# Patient Record
Sex: Male | Born: 1960 | Race: White | Hispanic: No | Marital: Married | Smoking: Never smoker
Health system: Southern US, Community
[De-identification: ages and names within clinical notes are randomized; demographics above are authoritative.]

## PROBLEM LIST (undated history)

## (undated) DIAGNOSIS — E119 Type 2 diabetes mellitus without complications: Secondary | ICD-10-CM

## (undated) DIAGNOSIS — R42 Dizziness and giddiness: Secondary | ICD-10-CM

## (undated) DIAGNOSIS — M5432 Sciatica, left side: Secondary | ICD-10-CM

## (undated) DIAGNOSIS — M1990 Unspecified osteoarthritis, unspecified site: Secondary | ICD-10-CM

## (undated) DIAGNOSIS — D509 Iron deficiency anemia, unspecified: Secondary | ICD-10-CM

## (undated) DIAGNOSIS — K219 Gastro-esophageal reflux disease without esophagitis: Secondary | ICD-10-CM

## (undated) DIAGNOSIS — E785 Hyperlipidemia, unspecified: Secondary | ICD-10-CM

## (undated) DIAGNOSIS — Z87828 Personal history of other (healed) physical injury and trauma: Secondary | ICD-10-CM

## (undated) DIAGNOSIS — I10 Essential (primary) hypertension: Secondary | ICD-10-CM

## (undated) HISTORY — DX: Type 2 diabetes mellitus without complications: E11.9

## (undated) HISTORY — DX: Gastro-esophageal reflux disease without esophagitis: K21.9

## (undated) HISTORY — DX: Unspecified osteoarthritis, unspecified site: M19.90

## (undated) HISTORY — PX: TONSILLECTOMY: SUR1361

## (undated) HISTORY — DX: Essential (primary) hypertension: I10

## (undated) HISTORY — DX: Hyperlipidemia, unspecified: E78.5

## (undated) HISTORY — DX: Personal history of other (healed) physical injury and trauma: Z87.828

## (undated) HISTORY — PX: KNEE ARTHROSCOPY: SUR90

---

## 2008-07-01 ENCOUNTER — Ambulatory Visit: Payer: Self-pay | Admitting: General Surgery

## 2008-07-03 ENCOUNTER — Ambulatory Visit: Payer: Self-pay | Admitting: General Surgery

## 2008-07-16 ENCOUNTER — Ambulatory Visit: Payer: Self-pay | Admitting: General Surgery

## 2008-07-22 ENCOUNTER — Ambulatory Visit: Payer: Self-pay | Admitting: General Surgery

## 2008-08-03 ENCOUNTER — Ambulatory Visit: Payer: Self-pay | Admitting: General Surgery

## 2008-08-03 HISTORY — PX: HERNIA REPAIR: SHX51

## 2008-10-16 DIAGNOSIS — Z87828 Personal history of other (healed) physical injury and trauma: Secondary | ICD-10-CM

## 2008-10-16 HISTORY — DX: Personal history of other (healed) physical injury and trauma: Z87.828

## 2008-11-30 IMAGING — NM NUCLEAR MEDICINE HEPATOHBILIARY INCLUDE GB
2 series · 12 of 12 positions shown · non-contrast
Comparison: none

REASON FOR EXAM: RUQ pain
COMMENTS:

[Series 1000: gallbladder dynamic · 4.80mm/px · 6 of 60 frames shown]
[frame 6/60]
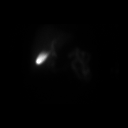
[frame 16/60]
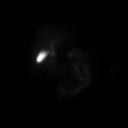
[frame 26/60]
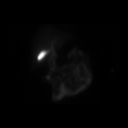
[frame 36/60]
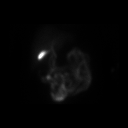
[frame 46/60]
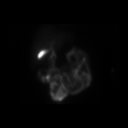
[frame 56/60]
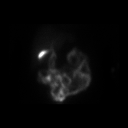

[Series 1000: gallbladder dynamic (results) · 4.80mm/px · 6 of 60 frames shown]
[frame 6/60]
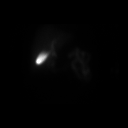
[frame 16/60]
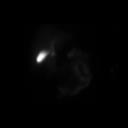
[frame 26/60]
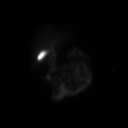
[frame 36/60]
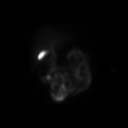
[frame 46/60]
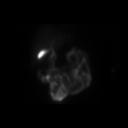
[frame 56/60]
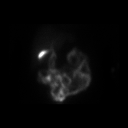

[12 of 12 positions shown; findings below may reference images not displayed]

PROCEDURE:     NM  - NM HEPATO WITH GB EJECT FRACTION  - July 16, 2008  [DATE]

RESULT:     The patient received 7.63 mCi of Technetium 99m labeled Choletec
for this study. The patient also received an intravenous drip infusion of
2.3 micrograms of Sincalide over 30 minutes.

There is adequate uptake of the radiopharmaceutical by the liver. The common
bile duct and gallbladder are visible by 10 minutes. Bowel activity is
visible by 15 minutes. The 30 minute gallbladder ejection fraction is normal
at 80%.
IMPRESSION: Normal Hepatobiliary Scan with normal gallbladder ejection
fraction. The patient experienced mild nausea during the CCK administration.

## 2008-12-06 IMAGING — RF DG UGI W/O KUB
1 series · 15 of 19 positions shown · non-contrast
Comparison: none

REASON FOR EXAM: Post prandial abdominal pain
COMMENTS:

[Series 1: run · 15 of 19 slices shown]
[im 1/19]
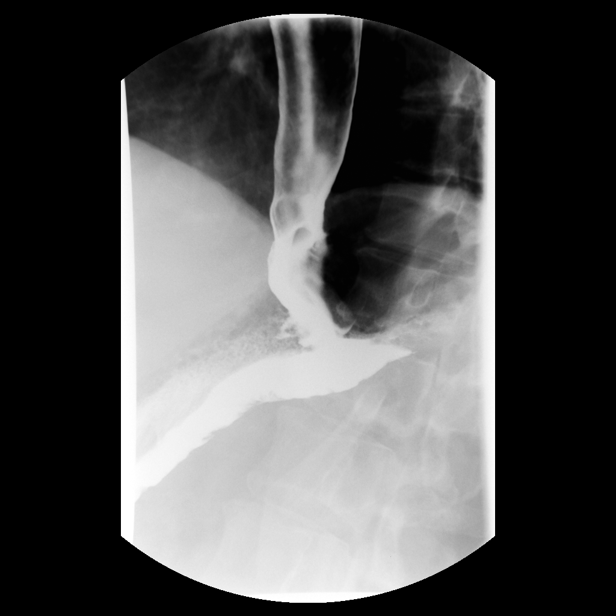
[im 2/19]
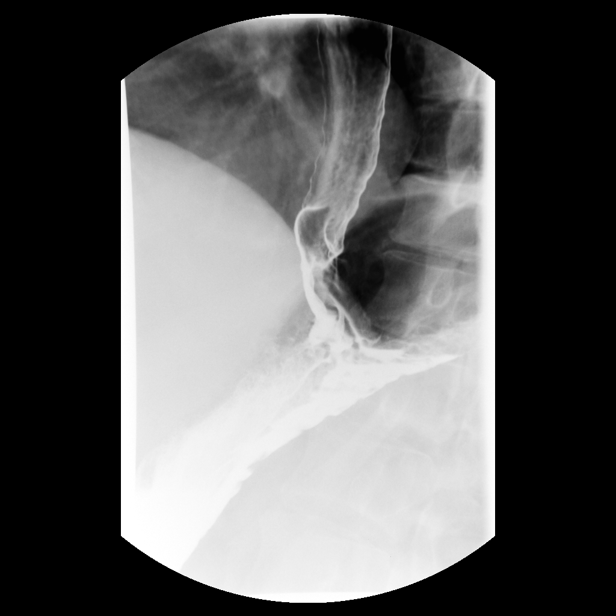
[im 4/19]
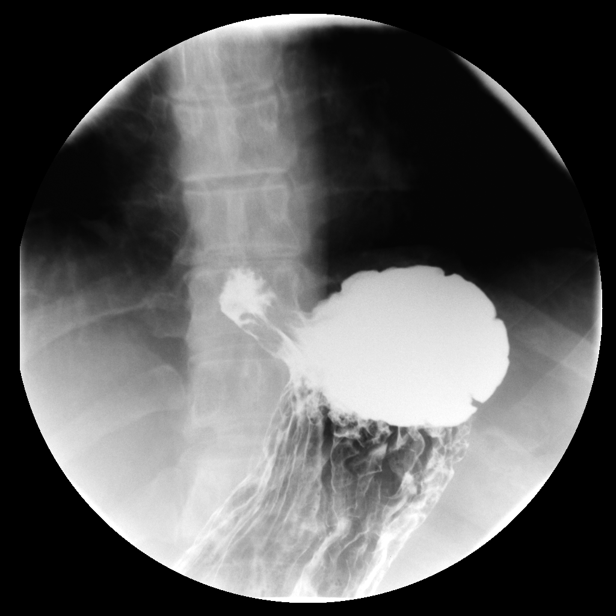
[im 5/19]
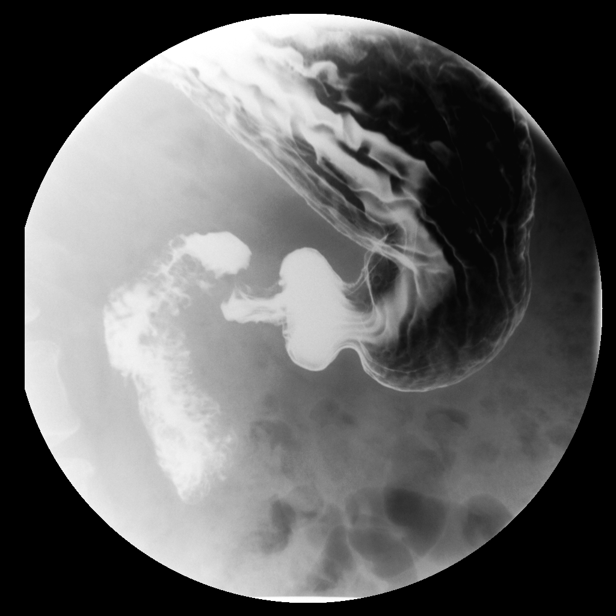
[im 6/19]
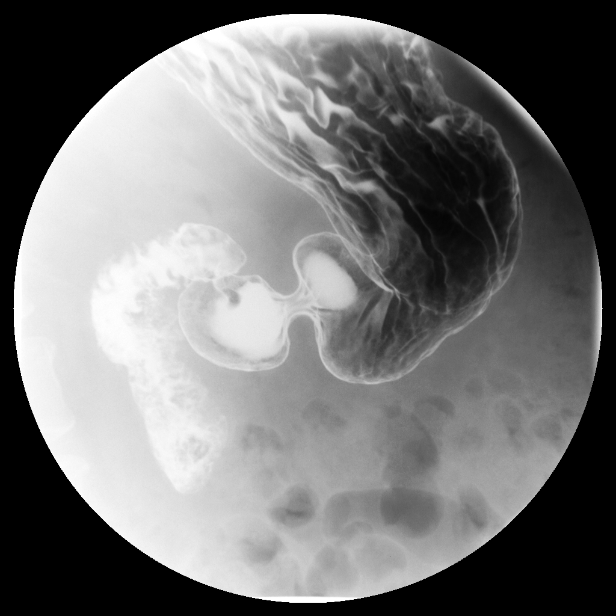
[im 7/19]
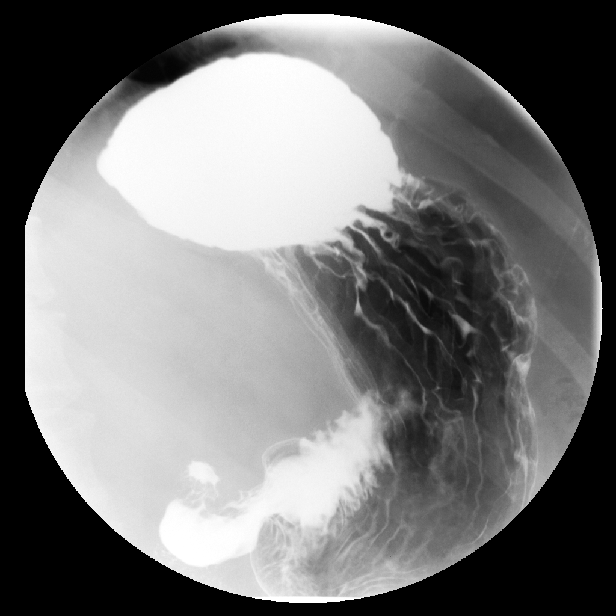
[im 9/19]
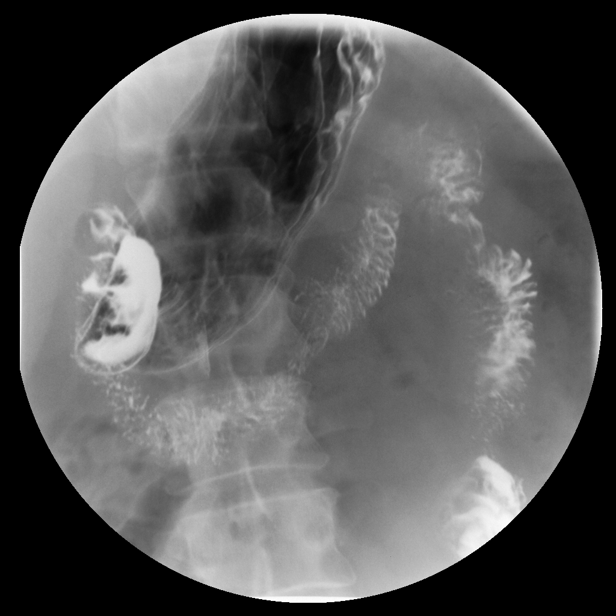
[im 10/19]
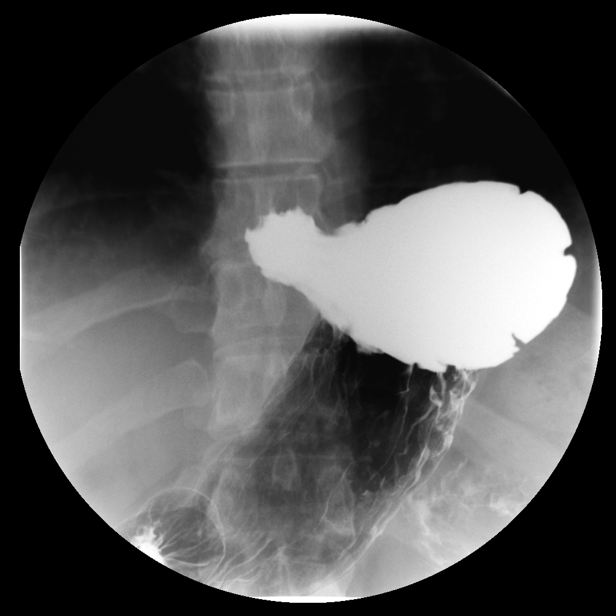
[im 11/19]
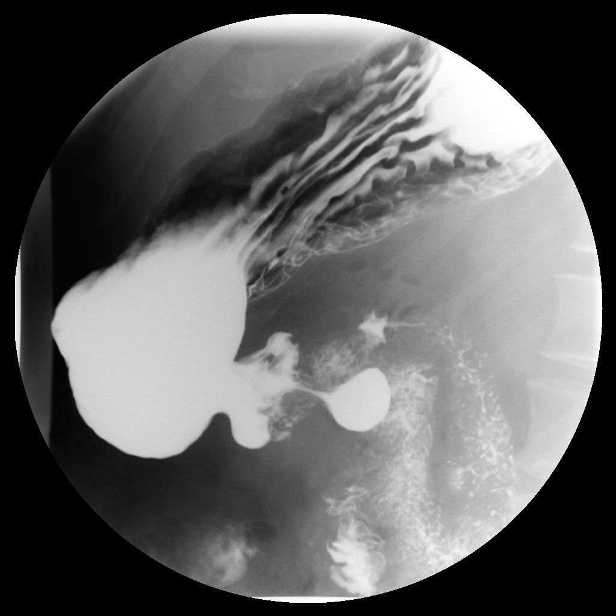
[im 13/19]
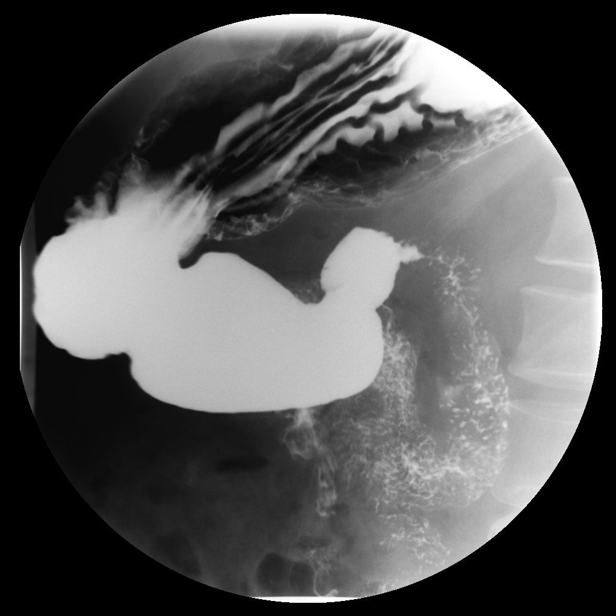
[im 14/19]
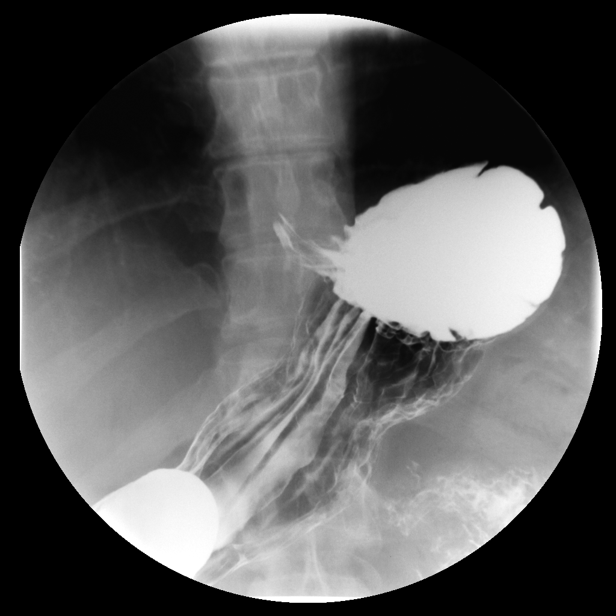
[im 15/19]
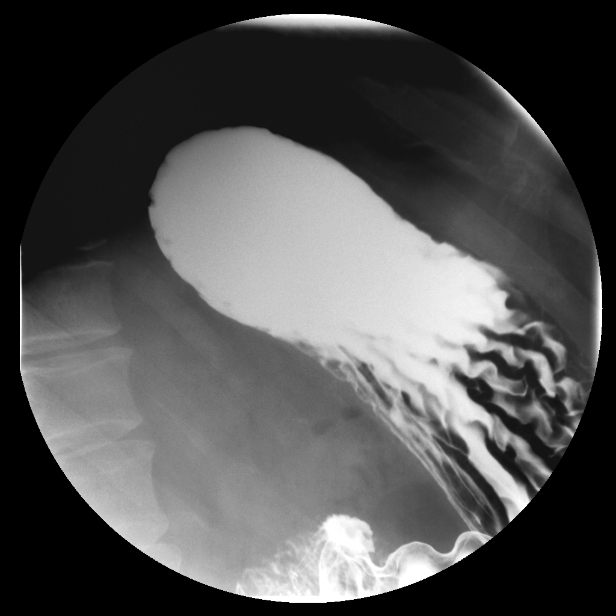
[im 16/19]
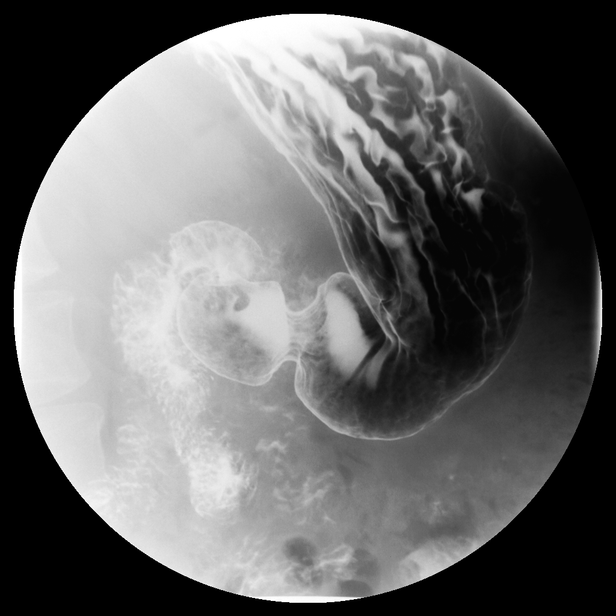
[im 18/19]
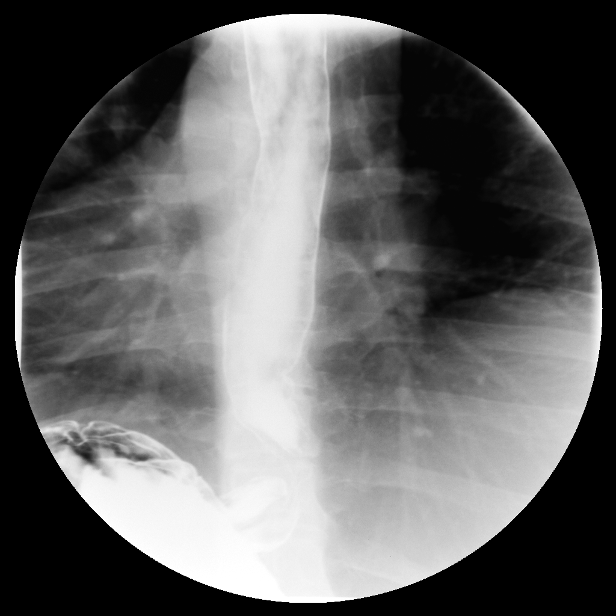
[im 19/19]
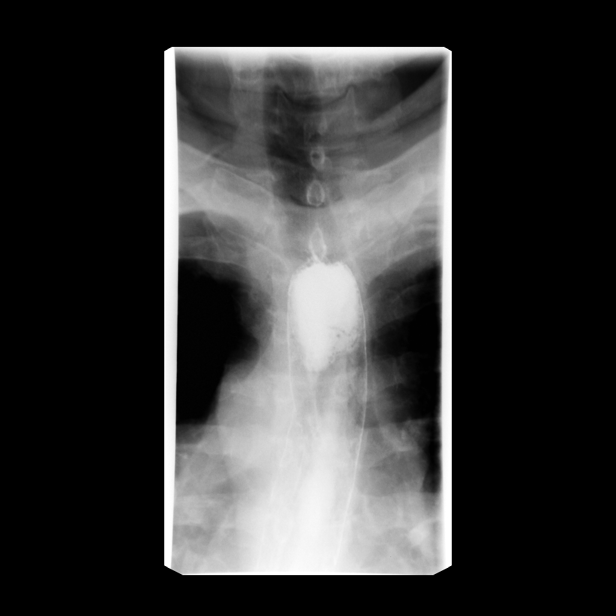

[15 of 19 positions shown; findings below may reference images not displayed]

PROCEDURE:     FL  - FL UPPER GI  - July 22, 2008  [DATE]

RESULT:     The patient is reporting abdominal discomfort following meals.
The patient has undergone work-up of the gallbladder which revealed no acute
abnormality.

The anticipated procedure was discussed with Mr. Pamplona. He voiced his
willingness to proceed.

The patient ingested barium without difficulty. Survey views of the
esophagus were normal. However, with the assumption of the prone position
there was marked gastroesophageal reflux to the level of the epiglottis.
There was no evidence of an esophageal stricture or of esophagitis.

The stomach distended well. The mucosal fold pattern was normal. Gastric
emptying appeared normal. The duodenal bulb and C-sweep were normal in
appearance. The 12.0 mm barium pill passed without difficulty.
IMPRESSION: 1.  There is severe gastroesophageal reflux with change of the patient from
the supine to prone position. This reproduced some of the patient's
symptoms.
2.  There is no evidence of a stricture or esophagitis.
3.  The stomach and duodenum are normal in appearance.

## 2008-12-14 ENCOUNTER — Emergency Department: Payer: Self-pay | Admitting: Internal Medicine

## 2011-04-13 ENCOUNTER — Emergency Department: Payer: Self-pay | Admitting: Unknown Physician Specialty

## 2012-07-12 ENCOUNTER — Emergency Department: Payer: Self-pay | Admitting: Emergency Medicine

## 2012-07-12 LAB — CBC
HCT: 42.3 % (ref 40.0–52.0)
MCHC: 34.8 g/dL (ref 32.0–36.0)
MCV: 83 fL (ref 80–100)
Platelet: 229 10*3/uL (ref 150–440)
RBC: 5.09 10*6/uL (ref 4.40–5.90)
RDW: 15.2 % — ABNORMAL HIGH (ref 11.5–14.5)
WBC: 7.1 10*3/uL (ref 3.8–10.6)

## 2012-07-12 LAB — BASIC METABOLIC PANEL
Anion Gap: 9 (ref 7–16)
BUN: 23 mg/dL — ABNORMAL HIGH (ref 7–18)
Calcium, Total: 9.2 mg/dL (ref 8.5–10.1)
EGFR (African American): >60
EGFR (Non-African Amer.): >60
Glucose: 190 mg/dL — ABNORMAL HIGH (ref 65–99)
Osmolality: 283 (ref 275–301)

## 2012-07-12 LAB — TROPONIN I: Troponin-I: <0.02 ng/mL

## 2012-07-13 LAB — CK TOTAL AND CKMB (NOT AT ARMC)
CK, Total: 173 U/L (ref 35–232)
CK-MB: 1.1 ng/mL (ref 0.5–3.6)

## 2012-07-13 LAB — TROPONIN I: Troponin-I: <0.02 ng/mL

## 2015-06-17 ENCOUNTER — Encounter: Payer: Self-pay | Admitting: *Deleted

## 2015-06-24 ENCOUNTER — Encounter: Payer: Self-pay | Admitting: General Surgery

## 2015-06-28 ENCOUNTER — Ambulatory Visit (INDEPENDENT_AMBULATORY_CARE_PROVIDER_SITE_OTHER): Payer: Managed Care, Other (non HMO) | Admitting: General Surgery

## 2015-06-28 ENCOUNTER — Encounter: Payer: Self-pay | Admitting: General Surgery

## 2015-06-28 VITALS — BP 120/74 | HR 84 | Resp 14 | Wt 249.0 lb

## 2015-06-28 DIAGNOSIS — R1031 Right lower quadrant pain: Secondary | ICD-10-CM | POA: Diagnosis not present

## 2015-06-28 DIAGNOSIS — R1030 Lower abdominal pain, unspecified: Secondary | ICD-10-CM | POA: Insufficient documentation

## 2015-06-28 NOTE — Patient Instructions (Signed)
Use 2 Aleve twice a day for 10 days. Use heat for comfort. Use proper lifting techniques.

## 2015-06-28 NOTE — Progress Notes (Signed)
Patient ID: Anthony Harvey, male   DOB: 1961/05/16, 54 y.o.   MRN: [id number]  Chief Complaint  Patient presents with  . Other    old surgical site pain    HPI Anthony Harvey is a 54 y.o. male here for assessment of pain in his right inguinal hernia repair site. This surgery was done on 08/03/2008. He first noticed the pain for the past 2 years. In the 3 months the pain has worsened and is occuring daily with activity. He reports that the pain is just below the repair site. He does report pain with bowel movements, but no difficulty with urinating. He did have a fall with back injury in 2010 and sees Dr Rennis Harding for this.  HPI  Past Medical History  Diagnosis Date  . Hypertension   . Hyperlipidemia   . Diabetes mellitus without complication   . GERD (gastroesophageal reflux disease)   . Arthritis   . Hx of back injury 2010    fell from piping and hit back    Past Surgical History  Procedure Laterality Date  . Hernia repair  08/03/2008    Right direct inguinal hernia, large Ultra Pro mesh.  . Tonsillectomy  as child    Family History  Problem Relation Age of Onset  . Cancer Mother   . Diabetes Mother     Social History Social History  Substance Use Topics  . Smoking status: Never Smoker   . Smokeless tobacco: Never Used  . Alcohol Use: No    Allergies  Allergen Reactions  . Celebrex [Celecoxib] Palpitations    Current Outpatient Prescriptions  Medication Sig Dispense Refill  . atorvastatin (LIPITOR) 20 MG tablet Take 20 mg by mouth daily.    . fenofibrate (TRICOR) 145 MG tablet Take 145 mg by mouth daily.    Marland Kitchen gemfibrozil (LOPID) 600 MG tablet Take 600 mg by mouth 2 (two) times daily.    Marland Kitchen HUMALOG MIX 75/25 KWIKPEN (75-25) 100 UNIT/ML Kwikpen Inject 25 Units as directed daily.    Marland Kitchen JANUVIA 100 MG tablet Take 50 mg by mouth daily.    Marland Kitchen lisinopril-hydrochlorothiazide (PRINZIDE,ZESTORETIC) 20-25 MG per tablet Take 1 tablet by mouth daily.    . metFORMIN  (GLUCOPHAGE) 500 MG tablet Take 500 mg by mouth 2 (two) times daily.    . metoprolol succinate (TOPROL-XL) 50 MG 24 hr tablet Take 50 mg by mouth daily.    Marland Kitchen omega-3 acid ethyl esters (LOVAZA) 1 G capsule Take 1 capsule by mouth daily.    Marland Kitchen omeprazole (PRILOSEC) 20 MG capsule Take 20 mg by mouth daily.    . traMADol (ULTRAM) 50 MG tablet Take 50 mg by mouth every 6 (six) hours as needed.    . traMADol (ULTRAM-ER) 300 MG 24 hr tablet Take 300 mg by mouth daily.    . TRULICITY 1.5 WU/9.8JX SOPN Inject 2 mLs as directed once a week.     No current facility-administered medications for this visit.    Review of Systems Review of Systems  Constitutional: Negative.   Respiratory: Negative.   Cardiovascular: Negative.   Gastrointestinal: Positive for abdominal pain (RLQ with bowel movements) and constipation. Negative for nausea, vomiting, diarrhea, blood in stool, abdominal distention, anal bleeding and rectal pain.  Genitourinary: Negative.     Blood pressure 120/74, pulse 84, resp. rate 14, weight 249 lb (112.946 kg).  Physical Exam Physical Exam  Constitutional: He is oriented to person, place, and time. He appears well-developed and well-nourished.  Eyes: Conjunctivae are normal. No scleral icterus.  Neck: Neck supple.  Cardiovascular: Normal rate, regular rhythm and normal heart sounds.   Pulmonary/Chest: Effort normal and breath sounds normal.  Abdominal: Soft. Normal appearance. There is tenderness (over the pubic tubercle). Hernia confirmed negative in the right inguinal area and confirmed negative in the left inguinal area.    Lymphadenopathy:    He has no cervical adenopathy.  Neurological: He is alert and oriented to person, place, and time.  Skin: Skin is warm and dry.  Psychiatric: He has a normal mood and affect.    Data Reviewed Operative report. 2.5 cm direct fascial defect noted. CT report from 07/01/2008 suggested fat within the inguinal canal but no definite  hernia. Assessment    Inguinal pain, right greater than left.    Plan    The patient reports she's having difficulty with his left lower extremity after his fall from 2010. He recognizes that he favors the left side, and this shift and weight may be accounting for the discomfort at the insertion of the rectus muscle and to the pubic tubercle. No indication of recurrent hernia at this time.  The patient reports he is able to tolerate Aleve. He was asked to make use of 2 tablets twice a day for 2 week course to see if this provides any benefit. Local heat was also encouraged.  Proper lifting technique reviewed.  He'll get a phone report after his trial of anti-inflammatory medication.    PCP:  Genevie Ann 06/29/2015, 11:57 AM

## 2016-11-14 ENCOUNTER — Encounter
Admission: RE | Admit: 2016-11-14 | Discharge: 2016-11-14 | Disposition: A | Discharge status: Home / Self Care | Payer: Managed Care, Other (non HMO) | Source: Ambulatory Visit | Attending: Orthopedic Surgery | Admitting: Orthopedic Surgery

## 2016-11-14 DIAGNOSIS — Z79899 Other long term (current) drug therapy: Secondary | ICD-10-CM | POA: Diagnosis not present

## 2016-11-14 DIAGNOSIS — S83221A Peripheral tear of medial meniscus, current injury, right knee, initial encounter: Secondary | ICD-10-CM | POA: Diagnosis not present

## 2016-11-14 DIAGNOSIS — S83001A Unspecified subluxation of right patella, initial encounter: Secondary | ICD-10-CM | POA: Diagnosis not present

## 2016-11-14 DIAGNOSIS — E785 Hyperlipidemia, unspecified: Secondary | ICD-10-CM | POA: Diagnosis not present

## 2016-11-14 DIAGNOSIS — I10 Essential (primary) hypertension: Secondary | ICD-10-CM | POA: Diagnosis not present

## 2016-11-14 DIAGNOSIS — K219 Gastro-esophageal reflux disease without esophagitis: Secondary | ICD-10-CM | POA: Diagnosis not present

## 2016-11-14 DIAGNOSIS — Z794 Long term (current) use of insulin: Secondary | ICD-10-CM | POA: Diagnosis not present

## 2016-11-14 DIAGNOSIS — E119 Type 2 diabetes mellitus without complications: Secondary | ICD-10-CM | POA: Diagnosis not present

## 2016-11-14 DIAGNOSIS — Z7982 Long term (current) use of aspirin: Secondary | ICD-10-CM | POA: Diagnosis not present

## 2016-11-14 DIAGNOSIS — X58XXXA Exposure to other specified factors, initial encounter: Secondary | ICD-10-CM | POA: Diagnosis not present

## 2016-11-14 NOTE — Pre-Procedure Instructions (Signed)
Had EKG  And labs at primary care in December of 2017, wife to fax results.

## 2016-11-14 NOTE — Patient Instructions (Signed)
  Your procedure is scheduled on: 11/16/16 Thurs Report to Same Day Surgery 2nd floor medical mall [facility] Entrance-take elevator on left to 2nd floor.  Check in with surgery information desk.) To find out your arrival time please call [phone] between 1PM - 3PM on 11/15/16 Wed  Remember: Instructions that are not followed completely may result in serious medical risk, up to and including death, or upon the discretion of your surgeon and anesthesiologist your surgery may need to be rescheduled.    _x___ 1. Do not eat food or drink liquids after midnight. No gum chewing or hard candies.     __x__ 2. No Alcohol for 24 hours before or after surgery.   __x__3. No Smoking for 24 prior to surgery.   ____  4. Bring all medications with you on the day of surgery if instructed.    __x__ 5. Notify your doctor if there is any change in your medical condition     (cold, fever, infections).     Do not wear jewelry, make-up, hairpins, clips or nail polish.  Do not wear lotions, powders, or perfumes. You may wear deodorant.  Do not shave 48 hours prior to surgery. Men may shave face and neck.  Do not bring valuables to the hospital.    [facility] is not responsible for any belongings or valuables.               Contacts, dentures or bridgework may not be worn into surgery.  Leave your suitcase in the car. After surgery it may be brought to your room.  For patients admitted to the hospital, discharge time is determined by your treatment team.   Patients discharged the day of surgery will not be allowed to drive home.  You will need someone to drive you home and stay with you the night of your procedure.    Please read over the following fact sheets that you were given:   [facility] Preparing for Surgery and or MRSA Information   _x___ Take these medicines the morning of surgery with A SIP OF WATER:    1. fenofibrate (TRICOR  2.metoprolol succinate (TOPROL-XL  3.omeprazole  (PRILOSEC  4.  5.  6.  ____Fleets enema or Magnesium Citrate as directed.   _x___ Use CHG Soap or sage wipes as directed on instruction sheet   ____ Use inhalers on the day of surgery and bring to hospital day of surgery  _x___ Stop metformin 2 days prior to surgery    ____ Take 1/2 of usual insulin dose the night before surgery and none on the morning of           surgery.   ____ Stop Aspirin, Coumadin, Pllavix ,Eliquis, Effient, or Pradaxa  x__ Stop Anti-inflammatories such as Advil, Aleve, Ibuprofen, Motrin, Naproxen,          Naprosyn, Goodies powders or aspirin products. Ok to take Tylenol.   ____ Stop supplements until after surgery.    ____ Bring C-Pap to the hospital.

## 2016-11-15 ENCOUNTER — Other Ambulatory Visit: Payer: Self-pay

## 2016-11-15 ENCOUNTER — Encounter
Admission: RE | Admit: 2016-11-15 | Discharge: 2016-11-15 | Disposition: A | Discharge status: Home / Self Care | Payer: Managed Care, Other (non HMO) | Source: Ambulatory Visit | Attending: Orthopedic Surgery | Admitting: Orthopedic Surgery

## 2016-11-15 DIAGNOSIS — S83221A Peripheral tear of medial meniscus, current injury, right knee, initial encounter: Secondary | ICD-10-CM | POA: Diagnosis not present

## 2016-11-16 ENCOUNTER — Ambulatory Visit: Payer: Managed Care, Other (non HMO) | Admitting: Anesthesiology

## 2016-11-16 ENCOUNTER — Encounter: Payer: Self-pay | Admitting: *Deleted

## 2016-11-16 ENCOUNTER — Ambulatory Visit
Admission: RE | Admit: 2016-11-16 | Discharge: 2016-11-16 | Disposition: A | Discharge status: Home / Self Care | Payer: Managed Care, Other (non HMO) | Source: Ambulatory Visit | Attending: Orthopedic Surgery | Admitting: Orthopedic Surgery

## 2016-11-16 ENCOUNTER — Encounter
Admission: RE | Disposition: A | Discharge status: Home / Self Care | Payer: Self-pay | Source: Ambulatory Visit | Attending: Orthopedic Surgery

## 2016-11-16 DIAGNOSIS — X58XXXA Exposure to other specified factors, initial encounter: Secondary | ICD-10-CM | POA: Insufficient documentation

## 2016-11-16 DIAGNOSIS — Z794 Long term (current) use of insulin: Secondary | ICD-10-CM | POA: Insufficient documentation

## 2016-11-16 DIAGNOSIS — I10 Essential (primary) hypertension: Secondary | ICD-10-CM | POA: Insufficient documentation

## 2016-11-16 DIAGNOSIS — K219 Gastro-esophageal reflux disease without esophagitis: Secondary | ICD-10-CM | POA: Insufficient documentation

## 2016-11-16 DIAGNOSIS — S83001A Unspecified subluxation of right patella, initial encounter: Secondary | ICD-10-CM | POA: Insufficient documentation

## 2016-11-16 DIAGNOSIS — Z7982 Long term (current) use of aspirin: Secondary | ICD-10-CM | POA: Insufficient documentation

## 2016-11-16 DIAGNOSIS — S83221A Peripheral tear of medial meniscus, current injury, right knee, initial encounter: Secondary | ICD-10-CM | POA: Insufficient documentation

## 2016-11-16 DIAGNOSIS — E785 Hyperlipidemia, unspecified: Secondary | ICD-10-CM | POA: Insufficient documentation

## 2016-11-16 DIAGNOSIS — E119 Type 2 diabetes mellitus without complications: Secondary | ICD-10-CM | POA: Insufficient documentation

## 2016-11-16 DIAGNOSIS — Z79899 Other long term (current) drug therapy: Secondary | ICD-10-CM | POA: Insufficient documentation

## 2016-11-16 HISTORY — PX: KNEE ARTHROSCOPY: SHX127

## 2016-11-16 LAB — GLUCOSE, CAPILLARY
GLUCOSE-CAPILLARY: 101 mg/dL — AB (ref 65–99)
Glucose-Capillary: 112 mg/dL — ABNORMAL HIGH (ref 65–99)

## 2016-11-16 SURGERY — ARTHROSCOPY, KNEE
Anesthesia: General | Laterality: Right | Wound class: Clean

## 2016-11-16 MED ORDER — DEXAMETHASONE SODIUM PHOSPHATE 10 MG/ML IJ SOLN
INTRAMUSCULAR | Status: AC
  Filled 2016-11-16: qty 1

## 2016-11-16 MED ORDER — BUPIVACAINE-EPINEPHRINE (PF) 0.5% -1:200000 IJ SOLN
INTRAMUSCULAR | Status: DC | PRN
  Administered 2016-11-16: 30 mL

## 2016-11-16 MED ORDER — FENTANYL CITRATE (PF) 100 MCG/2ML IJ SOLN: 25.0000 ug | INTRAMUSCULAR | Status: DC | PRN

## 2016-11-16 MED ORDER — MIDAZOLAM HCL 5 MG/5ML IJ SOLN
INTRAMUSCULAR | Status: DC | PRN
  Administered 2016-11-16: 2 mg via INTRAVENOUS

## 2016-11-16 MED ORDER — ONDANSETRON HCL 4 MG/2ML IJ SOLN
4.0000 mg | Freq: Once | INTRAMUSCULAR | Status: DC | PRN
Start: 2016-11-16 — End: 2016-11-16

## 2016-11-16 MED ORDER — ONDANSETRON HCL 4 MG PO TABS: 4.0000 mg | ORAL_TABLET | Freq: Four times a day (QID) | ORAL | Status: DC | PRN

## 2016-11-16 MED ORDER — CEFAZOLIN SODIUM-DEXTROSE 2-4 GM/100ML-% IV SOLN: 2.0000 g | Freq: Once | INTRAVENOUS | Status: DC

## 2016-11-16 MED ORDER — CEFAZOLIN IN D5W 1 GM/50ML IV SOLN
INTRAVENOUS | Status: DC | PRN
  Administered 2016-11-16: 2 g via INTRAVENOUS

## 2016-11-16 MED ORDER — SODIUM CHLORIDE 0.9 % IV SOLN: INTRAVENOUS | Status: DC

## 2016-11-16 MED ORDER — HYDROCODONE-ACETAMINOPHEN 5-325 MG PO TABS: 1.0000 | ORAL_TABLET | Freq: Four times a day (QID) | ORAL | 0 refills | Status: DC | PRN

## 2016-11-16 MED ORDER — BUPIVACAINE-EPINEPHRINE (PF) 0.5% -1:200000 IJ SOLN
INTRAMUSCULAR | Status: AC
  Filled 2016-11-16: qty 30

## 2016-11-16 MED ORDER — PROPOFOL 10 MG/ML IV BOLUS
INTRAVENOUS | Status: DC | PRN
  Administered 2016-11-16: 250 mg via INTRAVENOUS

## 2016-11-16 MED ORDER — GLYCOPYRROLATE 0.2 MG/ML IJ SOLN
INTRAMUSCULAR | Status: AC
Start: 2016-11-16 — End: 2016-11-16
  Filled 2016-11-16: qty 1

## 2016-11-16 MED ORDER — LIDOCAINE 2% (20 MG/ML) 5 ML SYRINGE
INTRAMUSCULAR | Status: DC | PRN
  Administered 2016-11-16: 100 mg via INTRAVENOUS

## 2016-11-16 MED ORDER — ONDANSETRON HCL 4 MG/2ML IJ SOLN: 4.0000 mg | Freq: Four times a day (QID) | INTRAMUSCULAR | Status: DC | PRN

## 2016-11-16 MED ORDER — FENTANYL CITRATE (PF) 100 MCG/2ML IJ SOLN
INTRAMUSCULAR | Status: DC | PRN
  Administered 2016-11-16: 50 ug via INTRAVENOUS
  Administered 2016-11-16: 25 ug via INTRAVENOUS

## 2016-11-16 MED ORDER — GLYCOPYRROLATE 0.2 MG/ML IJ SOLN
INTRAMUSCULAR | Status: DC | PRN
  Administered 2016-11-16: 0.2 mg via INTRAVENOUS

## 2016-11-16 MED ORDER — CEFAZOLIN SODIUM-DEXTROSE 2-4 GM/100ML-% IV SOLN
INTRAVENOUS | Status: AC
  Filled 2016-11-16: qty 100

## 2016-11-16 MED ORDER — DEXMEDETOMIDINE HCL 200 MCG/2ML IV SOLN
INTRAVENOUS | Status: DC | PRN
  Administered 2016-11-16: 8 ug via INTRAVENOUS

## 2016-11-16 MED ORDER — METOCLOPRAMIDE HCL 10 MG PO TABS: 5.0000 mg | ORAL_TABLET | Freq: Three times a day (TID) | ORAL | Status: DC | PRN

## 2016-11-16 MED ORDER — ONDANSETRON HCL 4 MG/2ML IJ SOLN
INTRAMUSCULAR | Status: AC
  Filled 2016-11-16: qty 2

## 2016-11-16 MED ORDER — METOCLOPRAMIDE HCL 5 MG/ML IJ SOLN: 5.0000 mg | Freq: Three times a day (TID) | INTRAMUSCULAR | Status: DC | PRN

## 2016-11-16 MED ORDER — HYDROCODONE-ACETAMINOPHEN 5-325 MG PO TABS
1.0000 | ORAL_TABLET | ORAL | Status: DC | PRN
  Administered 2016-11-16: 1 via ORAL

## 2016-11-16 MED ORDER — FENTANYL CITRATE (PF) 100 MCG/2ML IJ SOLN
INTRAMUSCULAR | Status: AC
  Filled 2016-11-16: qty 2

## 2016-11-16 MED ORDER — SODIUM CHLORIDE 0.9 % IV SOLN
INTRAVENOUS | Status: DC
  Administered 2016-11-16 (×2): via INTRAVENOUS

## 2016-11-16 MED ORDER — MIDAZOLAM HCL 2 MG/2ML IJ SOLN
INTRAMUSCULAR | Status: AC
  Filled 2016-11-16: qty 2

## 2016-11-16 MED ORDER — HYDROCODONE-ACETAMINOPHEN 5-325 MG PO TABS
ORAL_TABLET | ORAL | Status: AC
  Filled 2016-11-16: qty 1

## 2016-11-16 MED ORDER — LIDOCAINE HCL (PF) 2 % IJ SOLN
INTRAMUSCULAR | Status: AC
  Filled 2016-11-16: qty 2

## 2016-11-16 MED ORDER — PROPOFOL 500 MG/50ML IV EMUL
INTRAVENOUS | Status: AC
  Filled 2016-11-16: qty 50

## 2016-11-16 SURGICAL SUPPLY — 28 items
BANDAGE ACE 4X5 VEL STRL LF (GAUZE/BANDAGES/DRESSINGS) IMPLANT
BANDAGE ELASTIC 4 LF NS (GAUZE/BANDAGES/DRESSINGS) ×2 IMPLANT
BLADE FULL RADIUS 3.5 (BLADE) IMPLANT
BLADE INCISOR PLUS 4.5 (BLADE) ×2 IMPLANT
BLADE SHAVER 4.5 DBL SERAT CV (CUTTER) IMPLANT
BLADE SHAVER 4.5X7 STR FR (MISCELLANEOUS) ×2 IMPLANT
CHLORAPREP W/TINT 26ML (MISCELLANEOUS) ×2 IMPLANT
CUFF TOURN 24 STER (MISCELLANEOUS) IMPLANT
CUFF TOURN 30 STER DUAL PORT (MISCELLANEOUS) ×2 IMPLANT
GAUZE SPONGE 4X4 12PLY STRL (GAUZE/BANDAGES/DRESSINGS) ×2 IMPLANT
GLOVE SURG SYN 9.0  PF PI (GLOVE) ×1
GLOVE SURG SYN 9.0 PF PI (GLOVE) ×1 IMPLANT
GOWN SRG 2XL LVL 4 RGLN SLV (GOWNS) ×1 IMPLANT
GOWN STRL NON-REIN 2XL LVL4 (GOWNS) ×1
GOWN STRL REUS W/ TWL LRG LVL3 (GOWN DISPOSABLE) ×2 IMPLANT
GOWN STRL REUS W/TWL LRG LVL3 (GOWN DISPOSABLE) ×2
IV LACTATED RINGER IRRG 3000ML (IV SOLUTION) ×2
IV LR IRRIG 3000ML ARTHROMATIC (IV SOLUTION) ×2 IMPLANT
KIT RM TURNOVER STRD PROC AR (KITS) ×2 IMPLANT
MANIFOLD NEPTUNE II (INSTRUMENTS) ×2 IMPLANT
PACK ARTHROSCOPY KNEE (MISCELLANEOUS) ×2 IMPLANT
SET TUBE SUCT SHAVER OUTFL 24K (TUBING) ×2 IMPLANT
SET TUBE TIP INTRA-ARTICULAR (MISCELLANEOUS) ×2 IMPLANT
SUT ETHILON 4-0 (SUTURE) ×1
SUT ETHILON 4-0 FS2 18XMFL BLK (SUTURE) ×1
SUTURE ETHLN 4-0 FS2 18XMF BLK (SUTURE) ×1 IMPLANT
TUBING ARTHRO INFLOW-ONLY STRL (TUBING) ×2 IMPLANT
WAND HAND CNTRL MULTIVAC 50 (MISCELLANEOUS) ×2 IMPLANT

## 2016-11-16 NOTE — H&P (Signed)
Reviewed paper H+P, will be scanned into chart. No changes noted.  

## 2016-11-16 NOTE — Anesthesia Preprocedure Evaluation (Signed)
Anesthesia Evaluation  Patient identified by MRN, date of birth, ID band Patient awake    Reviewed: Allergy & Precautions, NPO status , Patient's Chart, lab work & pertinent test results, reviewed documented beta blocker date and time   Airway Mallampati: II  TM Distance: >3 FB     Dental  (+) Chipped   Pulmonary           Cardiovascular hypertension, Pt. on medications and Pt. on home beta blockers      Neuro/Psych    GI/Hepatic GERD  Controlled,  Endo/Other  diabetes, Type 2  Renal/GU      Musculoskeletal  (+) Arthritis ,   Abdominal   Peds  Hematology   Anesthesia Other Findings   Reproductive/Obstetrics                             Anesthesia Physical Anesthesia Plan  ASA: II  Anesthesia Plan: General   Post-op Pain Management:    Induction: Intravenous  Airway Management Planned: LMA  Additional Equipment:   Intra-op Plan:   Post-operative Plan:   Informed Consent: I have reviewed the patients History and Physical, chart, labs and discussed the procedure including the risks, benefits and alternatives for the proposed anesthesia with the patient or authorized representative who has indicated his/her understanding and acceptance.     Plan Discussed with: CRNA  Anesthesia Plan Comments:         Anesthesia Quick Evaluation

## 2016-11-16 NOTE — Discharge Instructions (Addendum)
AMBULATORY SURGERY  DISCHARGE INSTRUCTIONS   1) The drugs that you were given will stay in your system until tomorrow so for the next 24 hours you should not:  A) Drive an automobile B) Make any legal decisions C) Drink any alcoholic beverage   2) You may resume regular meals tomorrow.  Today it is better to start with liquids and gradually work up to solid foods.  You may eat anything you prefer, but it is better to start with liquids, then soup and crackers, and gradually work up to solid foods.   3) Please notify your doctor immediately if you have any unusual bleeding, trouble breathing, redness and pain at the surgery site, drainage, fever, or pain not relieved by medication.    4) Additional Instructions:        Please contact your physician with any problems or Same Day Surgery at [phone], Monday through Friday 6 am to 4 pm, or [place] at [facility] number at [phone].Minimize activities through the weekend. Try to keep leg propped up. Take aspirin 325 mg daily until walking normally. Ace wrap if it feels too tight. If bandage slides down the leg remove entire bandage covered to incisions with a Band-Aid each and then rewrap only Ace wrap not ane of the padding. Pain medicine as directed

## 2016-11-16 NOTE — Anesthesia Postprocedure Evaluation (Signed)
Anesthesia Post Note  Patient: Anthony Harvey  Procedure(s) Performed: Procedure(s) (LRB): ARTHROSCOPY KNEE, lateral release, partial meniscectomy (Right)  Patient location during evaluation: PACU Anesthesia Type: General Level of consciousness: awake and alert Pain management: pain level controlled Vital Signs Assessment: post-procedure vital signs reviewed and stable Respiratory status: spontaneous breathing, nonlabored ventilation, respiratory function stable and patient connected to nasal cannula oxygen Cardiovascular status: blood pressure returned to baseline and stable Postop Assessment: no signs of nausea or vomiting Anesthetic complications: no     Last Vitals:  Vitals:   11/16/16 1223 11/16/16 1300  BP: (!) 130/107 (!) 149/80  Pulse: 75 73  Resp: 16 16  Temp: 36.1 C     Last Pain:  Vitals:   11/16/16 1300  TempSrc:   PainSc: [place]

## 2016-11-16 NOTE — Op Note (Signed)
11/16/2016  11:20 AM  PATIENT:  Anthony Harvey  56 y.o. 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  BLOOD ADMINISTERED:none  DRAINS: none   LOCAL MEDICATIONS USED:  MARCAINE     SPECIMEN:  No Specimen  DISPOSITION OF SPECIMEN:  N/A  COUNTS:  YES  TOURNIQUET:    IMPLANTS: None  DICTATION: .Dragon Dictation patient brought the operating room and after adequate anesthesia was obtained, the right leg was placed in the arthroscopic leg holder with tourniquet applied. After prepping and draping the sterile fashion, appropriate patient identification and timeout procedure were completed. Inferolateral portal was made and the arthroscope was introduced. Initial inspection revealed some debris floating around the knee but no loose bodies gutters were free of any loose body there was patellar subluxation with central chondromalacia but no fibrillation down to the bone. There was some synovitis present at super patellar area which was later debrided for better visualization prior to the lateral release. Coming around medially and inferior medial portal was made and on probing there was a tear of the very most posterior aspect of the meniscus close to its root attachment as well as a radial tear at the junction of the middle and posterior thirds these were addressed with meniscal Punch shaver and ArthriCare wand total a stable margin was obtained. There was significant chondromalacia over both femoral tibial condyles with significant areas of loss of the superficial articular cartilage but nothing deeper than that. Anterior cruciate ligament was  intact lateral compartment was normal. After having addressed the medial meniscal pathology a lateral release was carried out and the knee was irrigated until clear following this with pre-and post procedure pictures having been obtained argentation was withdrawn and the wounds closed with simple interrupted 4-0 nylon with 30 cc half percent Sensorcaine infiltrated into the joint for postop analgesia. the wound was then wrapped with Xeroform 4 x 4 web roll and Ace wrap  PLAN OF CARE: Discharge to home after PACU  PATIENT DISPOSITION:  PACU - hemodynamically stable.

## 2016-11-16 NOTE — Anesthesia Procedure Notes (Signed)
Procedure Name: LMA Insertion Date/Time: 11/16/2016 10:25 AM Performed by: Marsh Dolly Pre-anesthesia Checklist: Patient identified, Patient being monitored, Timeout performed, Emergency Drugs available and Suction available Patient Re-evaluated:Patient Re-evaluated prior to inductionOxygen Delivery Method: Circle system utilized Preoxygenation: Pre-oxygenation with 100% oxygen Intubation Type: IV induction Ventilation: Mask ventilation without difficulty LMA: LMA inserted LMA Size: 5.0 Tube type: Oral Number of attempts: 1 Placement Confirmation: positive ETCO2 and breath sounds checked- equal and bilateral Tube secured with: Tape Dental Injury: Teeth and Oropharynx as per pre-operative assessment

## 2016-11-16 NOTE — Anesthesia Post-op Follow-up Note (Cosign Needed)
Anesthesia QCDR form completed.        

## 2016-11-16 NOTE — Transfer of Care (Signed)
Immediate Anesthesia Transfer of Care Note  Patient: Anthony Harvey  Procedure(s) Performed: Procedure(s): ARTHROSCOPY KNEE, lateral release, partial meniscectomy (Right)  Patient Location: PACU  Anesthesia Type:General  Level of Consciousness: sedated  Airway & Oxygen Therapy: Patient Spontanous Breathing and Patient connected to face mask oxygen  Post-op Assessment: Report given to RN and Post -op Vital signs reviewed and stable  Post vital signs: Reviewed and stable  Last Vitals:  Vitals:   11/16/16 0841 11/16/16 1124  BP: 138/75 122/71  Pulse: 77 79  Resp: 14 14  Temp: 36.2 C [id number] C    Complications: No apparent anesthesia complications

## 2016-12-01 ENCOUNTER — Other Ambulatory Visit
Admission: RE | Admit: 2016-12-01 | Discharge: 2016-12-01 | Disposition: A | Discharge status: Home / Self Care | Payer: Managed Care, Other (non HMO) | Source: Ambulatory Visit | Attending: Orthopedic Surgery | Admitting: Orthopedic Surgery

## 2016-12-01 DIAGNOSIS — Z9889 Other specified postprocedural states: Secondary | ICD-10-CM | POA: Diagnosis present

## 2016-12-01 DIAGNOSIS — M25461 Effusion, right knee: Secondary | ICD-10-CM | POA: Insufficient documentation

## 2016-12-01 LAB — SYNOVIAL CELL COUNT + DIFF, W/ CRYSTALS
CRYSTALS FLUID: NONE SEEN
EOSINOPHILS-SYNOVIAL: 0 %
LYMPHOCYTES-SYNOVIAL FLD: 36 %
Monocyte-Macrophage-Synovial Fluid: 14 %
NEUTROPHIL, SYNOVIAL: 50 %
Other Cells-SYN: 0
WBC, SYNOVIAL: 10270 /mm3 — AB (ref 0–200)

## 2016-12-04 LAB — GRAM STAIN

## 2016-12-09 LAB — CULTURE, BODY FLUID W GRAM STAIN -BOTTLE: Culture: NO GROWTH

## 2017-04-09 ENCOUNTER — Ambulatory Visit
Admission: RE | Admit: 2017-04-09 | Discharge: 2017-04-09 | Disposition: A | Discharge status: Home / Self Care | Payer: Disability Insurance | Source: Ambulatory Visit | Attending: Dentistry | Admitting: Dentistry

## 2017-04-09 ENCOUNTER — Other Ambulatory Visit: Payer: Self-pay | Admitting: Dentistry

## 2017-04-09 DIAGNOSIS — M25512 Pain in left shoulder: Secondary | ICD-10-CM | POA: Insufficient documentation

## 2017-04-09 DIAGNOSIS — G8929 Other chronic pain: Secondary | ICD-10-CM

## 2017-11-23 ENCOUNTER — Other Ambulatory Visit: Payer: Self-pay | Admitting: Orthopedic Surgery

## 2017-11-23 DIAGNOSIS — D482 Neoplasm of uncertain behavior of peripheral nerves and autonomic nervous system: Secondary | ICD-10-CM

## 2017-11-28 ENCOUNTER — Other Ambulatory Visit: Payer: Self-pay | Admitting: Orthopedic Surgery

## 2017-11-28 DIAGNOSIS — D482 Neoplasm of uncertain behavior of peripheral nerves and autonomic nervous system: Secondary | ICD-10-CM

## 2017-11-28 DIAGNOSIS — M25561 Pain in right knee: Secondary | ICD-10-CM

## 2017-12-07 ENCOUNTER — Ambulatory Visit
Admission: RE | Admit: 2017-12-07 | Discharge: 2017-12-07 | Disposition: A | Discharge status: Home / Self Care | Payer: 59 | Source: Ambulatory Visit | Attending: Orthopedic Surgery | Admitting: Orthopedic Surgery

## 2017-12-07 DIAGNOSIS — M1711 Unilateral primary osteoarthritis, right knee: Secondary | ICD-10-CM | POA: Diagnosis not present

## 2017-12-07 DIAGNOSIS — M25561 Pain in right knee: Secondary | ICD-10-CM | POA: Diagnosis present

## 2017-12-07 DIAGNOSIS — D482 Neoplasm of uncertain behavior of peripheral nerves and autonomic nervous system: Secondary | ICD-10-CM | POA: Diagnosis present

## 2017-12-07 LAB — POCT I-STAT CREATININE: Creatinine, Ser: 1.1 mg/dL (ref 0.61–1.24)

## 2017-12-07 MED ORDER — GADOBENATE DIMEGLUMINE 529 MG/ML IV SOLN
20.0000 mL | Freq: Once | INTRAVENOUS | Status: AC | PRN
  Administered 2017-12-07: 20 mL via INTRAVENOUS

## 2020-01-01 ENCOUNTER — Ambulatory Visit: Payer: Disability Insurance | Attending: Internal Medicine

## 2020-01-01 DIAGNOSIS — Z23 Encounter for immunization: Secondary | ICD-10-CM

## 2020-01-01 NOTE — Progress Notes (Signed)
   Covid-19 Vaccination Clinic  Name:  Anthony Harvey    MRN: [id number] DOB: 1961-04-17  01/01/2020  Mr. Anthony Harvey was observed post Covid-19 immunization for 15 minutes without incident. He was provided with Vaccine Information Sheet and instruction to access the V-Safe system.   Anthony Harvey was instructed to call 911 with any severe reactions post vaccine: Marland Kitchen Difficulty breathing  . Swelling of face and throat  . A fast heartbeat  . A bad rash all over body  . Dizziness and weakness   Immunizations Administered    Name Date Dose VIS Date Route   Pfizer COVID-19 Vaccine 01/01/2020 12:22 PM 0.3 mL 09/26/2019 Intramuscular   Manufacturer: [place]   Lot: [id number]   [place]: [id number]

## 2020-01-26 ENCOUNTER — Ambulatory Visit: Payer: Disability Insurance | Attending: Internal Medicine

## 2020-01-26 DIAGNOSIS — Z23 Encounter for immunization: Secondary | ICD-10-CM

## 2020-01-26 NOTE — Progress Notes (Signed)
   Covid-19 Vaccination Clinic  Name:  Anthony Harvey    MRN: [id number] DOB: December 23, 1960  01/26/2020  Mr. Braxton was observed post Covid-19 immunization for 15 minutes without incident. He was provided with Vaccine Information Sheet and instruction to access the V-Safe system.   Mr. Petrella was instructed to call 911 with any severe reactions post vaccine: Marland Kitchen Difficulty breathing  . Swelling of face and throat  . A fast heartbeat  . A bad rash all over body  . Dizziness and weakness   Immunizations Administered    Name Date Dose VIS Date Route   Pfizer COVID-19 Vaccine 01/26/2020 10:29 AM 0.3 mL 09/26/2019 Intramuscular   Manufacturer: [place]   Lot: [id number]   [place]: [id number]

## 2022-03-12 ENCOUNTER — Encounter: Payer: Self-pay | Admitting: Emergency Medicine

## 2022-03-12 ENCOUNTER — Ambulatory Visit
Admission: EM | Admit: 2022-03-12 | Discharge: 2022-03-12 | Disposition: A | Discharge status: Home / Self Care | Payer: Medicare Other | Attending: Family Medicine | Admitting: Family Medicine

## 2022-03-12 DIAGNOSIS — R42 Dizziness and giddiness: Secondary | ICD-10-CM | POA: Insufficient documentation

## 2022-03-12 DIAGNOSIS — E1165 Type 2 diabetes mellitus with hyperglycemia: Secondary | ICD-10-CM | POA: Diagnosis not present

## 2022-03-12 DIAGNOSIS — J029 Acute pharyngitis, unspecified: Secondary | ICD-10-CM

## 2022-03-12 DIAGNOSIS — R110 Nausea: Secondary | ICD-10-CM | POA: Diagnosis present

## 2022-03-12 DIAGNOSIS — Z20822 Contact with and (suspected) exposure to covid-19: Secondary | ICD-10-CM

## 2022-03-12 LAB — POCT FASTING CBG KUC MANUAL ENTRY: POCT Glucose (KUC): 194 mg/dL — AB (ref 70–99)

## 2022-03-12 LAB — POCT RAPID STREP A (OFFICE): Rapid Strep A Screen: NEGATIVE

## 2022-03-12 MED ORDER — AMOXICILLIN 875 MG PO TABS: 875.0000 mg | ORAL_TABLET | Freq: Two times a day (BID) | ORAL | 0 refills | Status: DC

## 2022-03-12 MED ORDER — ONDANSETRON HCL 4 MG PO TABS: 4.0000 mg | ORAL_TABLET | Freq: Four times a day (QID) | ORAL | 0 refills | Status: DC

## 2022-03-12 NOTE — ED Provider Notes (Signed)
Anthony Harvey    CSN: [id number] Arrival date & time: 03/12/22  1433      History   Chief Complaint Chief Complaint  Patient presents with   Sore Throat   Nausea   Dizziness   Headache    HPI Anthony Harvey is a 61 y.o. male.   HPI Patient presents with a two day history of worsening sore throat, fatigue, nausea, and headache. He is uncertain if he has had fever. Endorses poor appetite. He has been tolerating fluids. He has not have any OTC medications today. He has had a possible exposure to illness as has been staying at the hospital with his mother who is currently admitted for an unrelated matter. He denies any measurable URI symptoms or actual abdominal pain.  Past Medical History:  Diagnosis Date   Arthritis    Diabetes mellitus without complication ([place])    GERD (gastroesophageal reflux disease)    Hx of back injury 2010   fell from piping and hit back   Hyperlipidemia    Hypertension     Patient Active Problem List   Diagnosis Date Noted   Groin pain 06/28/2015    Past Surgical History:  Procedure Laterality Date   HERNIA REPAIR  08/03/2008   Right direct inguinal hernia, large Ultra Pro mesh.   KNEE ARTHROSCOPY Bilateral    KNEE ARTHROSCOPY Right 11/16/2016   Procedure: ARTHROSCOPY KNEE, lateral release, partial meniscectomy;  Surgeon: Hessie Knows, MD;  Location: [facility] ORS;  Service: Orthopedics;  Laterality: Right;   TONSILLECTOMY  as child       Home Medications    Prior to Admission medications   Medication Sig Start Date End Date Taking? Authorizing Provider  amoxicillin (AMOXIL) 875 MG tablet Take 1 tablet (875 mg total) by mouth 2 (two) times daily. 03/12/22  Yes Scot Jun, FNP  aspirin EC 81 MG tablet Take 81 mg by mouth at bedtime.   Yes [provider]  atorvastatin (LIPITOR) 40 MG tablet Take 40 mg by mouth daily at 6 PM.    Yes [provider]  fenofibrate (TRICOR) 145 MG tablet Take 145 mg by mouth  daily. 06/14/15  Yes [provider]  HUMALOG MIX 75/25 KWIKPEN (75-25) 100 UNIT/ML Kwikpen Inject 45 Units as directed 2 (two) times daily. With breakfast and at bedtime. 05/31/15  Yes [provider]  lisinopril-hydrochlorothiazide (PRINZIDE,ZESTORETIC) 20-25 MG per tablet Take 1 tablet by mouth daily. 06/14/15  Yes [provider]  metFORMIN (GLUCOPHAGE) 1000 MG tablet Take 1,000 mg by mouth 2 (two) times daily with a meal. 09/18/16  Yes [provider]  metoprolol succinate (TOPROL-XL) 50 MG 24 hr tablet Take 50 mg by mouth daily. 06/14/15  Yes [provider]  omega-3 acid ethyl esters (LOVAZA) 1 G capsule Take 1 g by mouth 2 (two) times daily.  06/14/15  Yes [provider]  omeprazole (PRILOSEC) 20 MG capsule Take 20 mg by mouth at bedtime.  06/14/15  Yes [provider]  ondansetron (ZOFRAN) 4 MG tablet Take 1 tablet (4 mg total) by mouth every 6 (six) hours. 03/12/22  Yes Scot Jun, FNP  sitaGLIPtin (JANUVIA) 50 MG tablet Take 50 mg by mouth 2 (two) times daily.   Yes [provider]  traMADol (ULTRAM) 50 MG tablet Take 50 mg by mouth every 6 (six) hours as needed (pain).  05/31/15  Yes [provider]  traMADol (ULTRAM-ER) 300 MG 24 hr tablet Take 300 mg by  mouth daily. 06/22/15  Yes [provider]  docusate sodium (COLACE) 100 MG capsule Take 100 mg by mouth at bedtime.    [provider]  HYDROcodone-acetaminophen (NORCO) 5-325 MG tablet Take 1 tablet by mouth every 6 (six) hours as needed for moderate pain. 11/16/16   Hessie Knows, MD  TRULICITY 1.5 WH/6.7RF SOPN Inject 1.5 mg as directed every 7 (seven) days. On Tuesdays 06/22/15   [provider]    Family History Family History  Problem Relation Age of Onset   Cancer Mother    Diabetes Mother     Social History Social History   Tobacco Use   Smoking status: Never   Smokeless tobacco: Never  Substance Use Topics    Alcohol use: No    Alcohol/week: 0.0 standard drinks   Drug use: No     Allergies   Cymbalta [duloxetine hcl], Pregabalin, Celebrex [celecoxib], and Niacin   Review of Systems Review of Systems Pertinent negatives listed in HPI   Physical Exam Triage Vital Signs ED Triage Vitals  Enc Vitals Group     BP 03/12/22 1517 (!) 148/81     Pulse Rate 03/12/22 1517 75     Resp 03/12/22 1517 18     Temp 03/12/22 1518 98.1 F (36.7 C)     Temp src --      SpO2 03/12/22 1517 98 %     Weight --      Height --      Head Circumference --      Peak Flow --      Pain Score 03/12/22 1517 0     Pain Loc --      Pain Edu? --      Excl. in [place]? --    No data found.  Updated Vital Signs BP (!) 148/81 (BP Location: Left Arm)   Pulse 75   Temp 98.1 F (36.7 C)   Resp 18   SpO2 98%   Visual Acuity Right Eye Distance:   Left Eye Distance:   Bilateral Distance:    Right Eye Near:   Left Eye Near:    Bilateral Near:     Physical Exam Vitals reviewed.  Constitutional:      Appearance: He is well-developed.  HENT:     Head: Normocephalic and atraumatic.     Mouth/Throat:     Mouth: No oral lesions.     Pharynx: Posterior oropharyngeal erythema present. No pharyngeal swelling.     Tonsils: 0 on the right. 0 on the left.  Cardiovascular:     Rate and Rhythm: Normal rate and regular rhythm.  Abdominal:     General: Bowel sounds are increased.     Palpations: Abdomen is soft.     Tenderness: There is no abdominal tenderness.  Musculoskeletal:     Cervical back: Normal range of motion.  Lymphadenopathy:     Cervical: No cervical adenopathy.  Skin:    General: Skin is warm and dry.     Capillary Refill: Capillary refill takes less than 2 seconds.  Neurological:     General: No focal deficit present.     Mental Status: He is alert and oriented to person, place, and time.  Psychiatric:        Behavior: Behavior normal.     UC Treatments / Results  Labs (all labs ordered  are listed, but only abnormal results are displayed) Labs Reviewed  POCT FASTING CBG [facility] MANUAL ENTRY - Abnormal; Notable for the  following components:      Result Value   POCT Glucose ([facility]) 194 (*)    All other components within normal limits  COVID-19, FLU A+B AND RSV   Narrative:    Performed at:  [address], [place], [place]  [id number] Lab Director: Rush Farmer MD, Phone:  [phone]  CULTURE, GROUP A STREP [facility])  POCT RAPID STREP A (OFFICE)    EKG   Radiology No results found.  Procedures Procedures (including critical care time)  Medications Ordered in UC Medications - No data to display  Initial Impression / Assessment and Plan / UC Course  I have reviewed the triage vital signs and the nursing notes.  Pertinent labs & imaging results that were available during my care of the patient were reviewed by me and considered in my medical decision making (see chart for details).    Acute pharyngitis, rapid strep negative  Nausea without vomiting Dizziness- no acute focal symptoms  Hyperglycemia, 194 elevated but stable. Continue to monitor. COVID test pending. Final Clinical Impressions(s) / UC Diagnoses   Final diagnoses:  Acute pharyngitis, unspecified etiology  Nausea without vomiting  Dizziness  Encounter for laboratory testing for COVID-19 virus  Hyperglycemia due to diabetes mellitus [facility])     Discharge Instructions      COVID/Flu test pending and will result within 3-4 days due to the holiday. If any of your symptoms worsen I would recommend evaluation in the emergency department. I am treating you for possible strep pharyngitis with amoxicillin.  However I cannot rule out that your symptoms are not not related to a COVID-19 virus.  Hydrate with plenty of fluids.  I have prescribed Zofran for management of nausea.  Force fluids.  Recommend rest. t CDC isolation/quarantine recommendation advised.       ED Prescriptions      Medication Sig Dispense Auth. Provider   ondansetron (ZOFRAN) 4 MG tablet Take 1 tablet (4 mg total) by mouth every 6 (six) hours. 12 tablet Scot Jun, FNP   amoxicillin (AMOXIL) 875 MG tablet Take 1 tablet (875 mg total) by mouth 2 (two) times daily. 20 tablet Scot Jun, FNP      PDMP not reviewed this encounter.   Scot Jun, [place] 03/16/22 [phone]

## 2022-03-12 NOTE — ED Triage Notes (Signed)
Pt c/o ST, nausea, HA and dizziness since yesterday.

## 2022-03-12 NOTE — Discharge Instructions (Addendum)
COVID/Flu test pending and will result within 3-4 days due to the holiday. If any of your symptoms worsen I would recommend evaluation in the emergency department. I am treating you for possible strep pharyngitis with amoxicillin.  However I cannot rule out that your symptoms are not not related to a COVID-19 virus.  Hydrate with plenty of fluids.  I have prescribed Zofran for management of nausea.  Force fluids.  Recommend rest. t CDC isolation/quarantine recommendation advised.

## 2022-03-14 LAB — COVID-19, FLU A+B AND RSV
Influenza A, NAA: NOT DETECTED
Influenza B, NAA: NOT DETECTED
RSV, NAA: NOT DETECTED
SARS-CoV-2, NAA: NOT DETECTED

## 2022-03-15 LAB — CULTURE, GROUP A STREP (THRC)

## 2022-03-15 NOTE — ED Provider Notes (Incomplete)
Anthony Harvey    CSN: [id number] Arrival date & time: 03/12/22  1433      History   Chief Complaint Chief Complaint  Patient presents with  . Sore Throat  . Nausea  . Dizziness  . Headache    HPI Anthony Harvey is a 61 y.o. male.   HPI Patient presents today with nausea, dizziness, HA , sore throat x 2 days. He has been at hospital all week with   Past Medical History:  Diagnosis Date  . Arthritis   . Diabetes mellitus without complication ([place])   . GERD (gastroesophageal reflux disease)   . Hx of back injury 2010   fell from piping and hit back  . Hyperlipidemia   . Hypertension     Patient Active Problem List   Diagnosis Date Noted  . Groin pain 06/28/2015    Past Surgical History:  Procedure Laterality Date  . HERNIA REPAIR  08/03/2008   Right direct inguinal hernia, large Ultra Pro mesh.  Marland Kitchen KNEE ARTHROSCOPY Bilateral   . KNEE ARTHROSCOPY Right 11/16/2016   Procedure: ARTHROSCOPY KNEE, lateral release, partial meniscectomy;  Surgeon: Hessie Knows, MD;  Location: [facility] ORS;  Service: Orthopedics;  Laterality: Right;  . TONSILLECTOMY  as child       Home Medications    Prior to Admission medications   Medication Sig Start Date End Date Taking? Authorizing Provider  aspirin EC 81 MG tablet Take 81 mg by mouth at bedtime.   Yes [provider]  atorvastatin (LIPITOR) 40 MG tablet Take 40 mg by mouth daily at 6 PM.    Yes [provider]  fenofibrate (TRICOR) 145 MG tablet Take 145 mg by mouth daily. 06/14/15  Yes [provider]  HUMALOG MIX 75/25 KWIKPEN (75-25) 100 UNIT/ML Kwikpen Inject 45 Units as directed 2 (two) times daily. With breakfast and at bedtime. 05/31/15  Yes [provider]  lisinopril-hydrochlorothiazide (PRINZIDE,ZESTORETIC) 20-25 MG per tablet Take 1 tablet by mouth daily. 06/14/15  Yes [provider]  metFORMIN (GLUCOPHAGE) 1000 MG tablet Take 1,000 mg by mouth 2 (two) times daily with  a meal. 09/18/16  Yes [provider]  metoprolol succinate (TOPROL-XL) 50 MG 24 hr tablet Take 50 mg by mouth daily. 06/14/15  Yes [provider]  omega-3 acid ethyl esters (LOVAZA) 1 G capsule Take 1 g by mouth 2 (two) times daily.  06/14/15  Yes [provider]  omeprazole (PRILOSEC) 20 MG capsule Take 20 mg by mouth at bedtime.  06/14/15  Yes [provider]  sitaGLIPtin (JANUVIA) 50 MG tablet Take 50 mg by mouth 2 (two) times daily.   Yes [provider]  traMADol (ULTRAM) 50 MG tablet Take 50 mg by mouth every 6 (six) hours as needed (pain).  05/31/15  Yes [provider]  traMADol (ULTRAM-ER) 300 MG 24 hr tablet Take 300 mg by mouth daily. 06/22/15  Yes [provider]  docusate sodium (COLACE) 100 MG capsule Take 100 mg by mouth at bedtime.    [provider]  HYDROcodone-acetaminophen (NORCO) 5-325 MG tablet Take 1 tablet by mouth every 6 (six) hours as needed for moderate pain. 11/16/16   Hessie Knows, MD  TRULICITY 1.5 KD/9.8PJ SOPN Inject 1.5 mg as directed every 7 (seven) days. On Tuesdays 06/22/15   [provider]    Family History Family History  Problem Relation Age of Onset  . Cancer Mother   . Diabetes Mother     Social  History Social History   Tobacco Use  . Smoking status: Never  . Smokeless tobacco: Never  Substance Use Topics  . Alcohol use: No    Alcohol/week: 0.0 standard drinks  . Drug use: No     Allergies   Cymbalta [duloxetine hcl], Pregabalin, Celebrex [celecoxib], and Niacin   Review of Systems Review of Systems   Physical Exam Triage Vital Signs ED Triage Vitals  Enc Vitals Group     BP 03/12/22 1517 (!) 148/81     Pulse Rate 03/12/22 1517 75     Resp 03/12/22 1517 18     Temp 03/12/22 1518 98.1 F (36.7 C)     Temp src --      SpO2 03/12/22 1517 98 %     Weight --      Height --      Head Circumference --      Peak Flow --      Pain Score 03/12/22 1517 0      Pain Loc --      Pain Edu? --      Excl. in [place]? --    No data found.  Updated Vital Signs BP (!) 148/81 (BP Location: Left Arm)   Pulse 75   Temp 98.1 F (36.7 C)   Resp 18   SpO2 98%   Visual Acuity Right Eye Distance:   Left Eye Distance:   Bilateral Distance:    Right Eye Near:   Left Eye Near:    Bilateral Near:     Physical Exam   UC Treatments / Results  Labs (all labs ordered are listed, but only abnormal results are displayed) Labs Reviewed  POCT FASTING CBG [facility] MANUAL ENTRY - Abnormal; Notable for the following components:      Result Value   POCT Glucose ([facility]) 194 (*)    All other components within normal limits    EKG   Radiology No results found.  Procedures Procedures (including critical care time)  Medications Ordered in UC Medications - No data to display  Initial Impression / Assessment and Plan / UC Course  I have reviewed the triage vital signs and the nursing notes.  Pertinent labs & imaging results that were available during my care of the patient were reviewed by me and considered in my medical decision making (see chart for details).     *** Final Clinical Impressions(s) / UC Diagnoses   Final diagnoses:  None   Discharge Instructions   None    ED Prescriptions   None    PDMP not reviewed this encounter.

## 2023-06-06 LAB — LAB REPORT - SCANNED
A1c: 8.3
EGFR: 61

## 2023-08-20 ENCOUNTER — Encounter: Payer: Self-pay | Admitting: Gastroenterology

## 2023-08-20 ENCOUNTER — Ambulatory Visit: Payer: Medicare Other | Admitting: Gastroenterology

## 2023-08-20 VITALS — BP 161/79 | HR 73 | Temp 98.3°F | Ht 70.0 in | Wt 253.0 lb

## 2023-08-20 DIAGNOSIS — D649 Anemia, unspecified: Secondary | ICD-10-CM | POA: Diagnosis not present

## 2023-08-20 NOTE — Progress Notes (Signed)
Gastroenterology Consultation  Referring Provider:     Alan Mulder, MD Primary Care Physician:  Sherrie Mustache, MD Primary Gastroenterologist:  Dr. Servando Snare     Reason for Consultation:     Anemia        HPI:   Anthony Harvey is a 62 y.o. y/o male referred for consultation & management of anemia by Dr. Sherrie Mustache, MD. This patient comes in today after being found to have a hemoglobin of 12.1.  The patient's MCV was normal and I do not have any iron studies from the primary care's office.  The patient was originally sent to Dr. Katharine Look at [facility] clinic surgery department for a screening colonoscopy.  In February it was determined that due to the patient having a normal Cologuard test that the surgeon denied a screening colonoscopy.  The patient was then sent to me for a possible colonoscopy and anemia. The patient states that he has never had a colonoscopy and that is why he was wanting to have a colonoscopy.  The patient's referral came over his anemia but did not define what the patient's anemia was iron deficiency or not.  Past Medical History:  Diagnosis Date   Arthritis    Diabetes mellitus without complication ([facility])    GERD (gastroesophageal reflux disease)    Hx of back injury 2010   fell from piping and hit back   Hyperlipidemia    Hypertension     Past Surgical History:  Procedure Laterality Date   HERNIA REPAIR  08/03/2008   Right direct inguinal hernia, large Ultra Pro mesh.   KNEE ARTHROSCOPY Bilateral    KNEE ARTHROSCOPY Right 11/16/2016   Procedure: ARTHROSCOPY KNEE, lateral release, partial meniscectomy;  Surgeon: Kennedy Bucker, MD;  Location: [facility] ORS;  Service: Orthopedics;  Laterality: Right;   TONSILLECTOMY  as child    Prior to Admission medications   Medication Sig Start Date End Date Taking? Authorizing Provider  amoxicillin (AMOXIL) 875 MG tablet Take 1 tablet (875 mg total) by mouth 2 (two) times daily. 03/12/22   Bing Neighbors, NP  aspirin EC  81 MG tablet Take 81 mg by mouth at bedtime.    [provider]  atorvastatin (LIPITOR) 40 MG tablet Take 40 mg by mouth daily at 6 PM.     [provider]  docusate sodium (COLACE) 100 MG capsule Take 100 mg by mouth at bedtime.    [provider]  fenofibrate (TRICOR) 145 MG tablet Take 145 mg by mouth daily. 06/14/15   [provider]  HUMALOG MIX 75/25 KWIKPEN (75-25) 100 UNIT/ML Kwikpen Inject 45 Units as directed 2 (two) times daily. With breakfast and at bedtime. 05/31/15   [provider]  HYDROcodone-acetaminophen (NORCO) 5-325 MG tablet Take 1 tablet by mouth every 6 (six) hours as needed for moderate pain. 11/16/16   Kennedy Bucker, MD  lisinopril-hydrochlorothiazide (PRINZIDE,ZESTORETIC) 20-25 MG per tablet Take 1 tablet by mouth daily. 06/14/15   [provider]  metFORMIN (GLUCOPHAGE) 1000 MG tablet Take 1,000 mg by mouth 2 (two) times daily with a meal. 09/18/16   [provider]  metoprolol succinate (TOPROL-XL) 50 MG 24 hr tablet Take 50 mg by mouth daily. 06/14/15   [provider]  omega-3 acid ethyl esters (LOVAZA) 1 G capsule Take 1 g by mouth 2 (two) times daily.  06/14/15   [provider]  omeprazole (PRILOSEC) 20 MG capsule Take 20 mg by mouth at bedtime.  06/14/15  [provider]  ondansetron (ZOFRAN) 4 MG tablet Take 1 tablet (4 mg total) by mouth every 6 (six) hours. 03/12/22   Bing Neighbors, NP  sitaGLIPtin (JANUVIA) 50 MG tablet Take 50 mg by mouth 2 (two) times daily.    [provider]  traMADol (ULTRAM) 50 MG tablet Take 50 mg by mouth every 6 (six) hours as needed (pain).  05/31/15   [provider]  traMADol (ULTRAM-ER) 300 MG 24 hr tablet Take 300 mg by mouth daily. 06/22/15   [provider]  TRULICITY 1.5 MG/0.5ML SOPN Inject 1.5 mg as directed every 7 (seven) days. On Tuesdays 06/22/15   [provider]    Family History  Problem Relation Age  of Onset   Cancer Mother    Diabetes Mother      Social History   Tobacco Use   Smoking status: Never   Smokeless tobacco: Never  Substance Use Topics   Alcohol use: No    Alcohol/week: 0.0 standard drinks of alcohol   Drug use: No    Allergies as of 08/20/2023 - Review Complete 08/20/2023  Allergen Reaction Noted   Cymbalta [duloxetine hcl] Other (See Comments) 01/28/2015   Pregabalin  10/19/2016   Celebrex [celecoxib] Palpitations 06/28/2015   Niacin Hives and Rash 10/19/2016    Review of Systems:    All systems reviewed and negative except where noted in HPI.   Physical Exam:  BP (!) 161/79 (BP Location: Left Arm, Patient Position: Sitting, Cuff Size: Large)   Pulse 73   Temp 98.3 F (36.8 C) (Oral)   Ht 5\' 10"  (1.778 m)   Wt 253 lb (114.8 kg)   BMI 36.30 kg/m  No LMP for male patient. General:   Alert,  Well-developed, well-nourished, pleasant and cooperative in NAD Head:  Normocephalic and atraumatic. Eyes:  Sclera clear, no icterus.   Conjunctiva pink. Ears:  Normal auditory acuity. Rectal:  Deferred.  Neurologic:  Alert and oriented x3;  grossly normal neurologically. Skin:  Intact without significant lesions or rashes.  No jaundice. Lymph Nodes:  No significant cervical adenopathy. Psych:  Alert and cooperative. Normal mood and affect.  Imaging Studies: No results found.  Assessment and Plan:   Anthony Harvey is a 62 y.o. y/o male who comes in today with a history of anemia with a referral for the anemia.  The patient will have his iron studies sent off and if the iron studies do show iron deficiency anemia the patient will need an EGD and colonoscopy with a possible capsule endoscopy if those are negative.  If the patient's iron come back is normal but the patient will be set up for screening colonoscopy since he has never had a screening colonoscopy.  The patient has been explained the plan and agrees with it.   Midge Minium, MD. Clementeen Graham    Note: This  dictation was prepared with Dragon dictation along with smaller phrase technology. Any transcriptional errors that result from this process are unintentional.

## 2023-08-21 ENCOUNTER — Other Ambulatory Visit: Payer: Self-pay

## 2023-08-21 DIAGNOSIS — Z1211 Encounter for screening for malignant neoplasm of colon: Secondary | ICD-10-CM

## 2023-08-21 DIAGNOSIS — D509 Iron deficiency anemia, unspecified: Secondary | ICD-10-CM

## 2023-08-21 LAB — IRON,TIBC AND FERRITIN PANEL
Ferritin: 15 ng/mL — ABNORMAL LOW (ref 30–400)
Iron Saturation: 17 % (ref 15–55)
Iron: 87 ug/dL (ref 38–169)
Total Iron Binding Capacity: 503 ug/dL — ABNORMAL HIGH (ref 250–450)
UIBC: 416 ug/dL — ABNORMAL HIGH (ref 111–343)

## 2023-08-21 MED ORDER — NA SULFATE-K SULFATE-MG SULF 17.5-3.13-1.6 GM/177ML PO SOLN: 1.0000 | Freq: Once | ORAL | 0 refills | Status: AC

## 2023-09-04 ENCOUNTER — Encounter: Payer: Self-pay | Admitting: Gastroenterology

## 2023-09-06 ENCOUNTER — Encounter: Payer: Self-pay | Admitting: Gastroenterology

## 2023-09-06 NOTE — Anesthesia Preprocedure Evaluation (Addendum)
Anesthesia Evaluation  Patient identified by MRN, date of birth, ID band Patient awake    Reviewed: Allergy & Precautions, H&P , NPO status , Patient's Chart, lab work & pertinent test results  Airway Mallampati: II  TM Distance: >3 FB Neck ROM: Full    Dental no notable dental hx. (+) Caps Cap right lower jaw:   Pulmonary neg pulmonary ROS   Pulmonary exam normal breath sounds clear to auscultation       Cardiovascular hypertension, negative cardio ROS Normal cardiovascular exam Rhythm:Regular Rate:Normal     Neuro/Psych  Neuromuscular disease negative neurological ROS  negative psych ROS   GI/Hepatic negative GI ROS, Neg liver ROS,GERD  ,,  Endo/Other  negative endocrine ROSdiabetes    Renal/GU negative Renal ROS  negative genitourinary   Musculoskeletal negative musculoskeletal ROS (+) Arthritis ,    Abdominal   Peds negative pediatric ROS (+)  Hematology negative hematology ROS (+) Blood dyscrasia, anemia   Anesthesia Other Findings Hypertension  Hyperlipidemia Diabetes mellitus without complication GERD (gastroesophageal reflux disease) Arthritis  Hx of back injury Sciatica of left side  Vertigo Iron deficiency anemia     Reproductive/Obstetrics negative OB ROS                             Anesthesia Physical Anesthesia Plan  ASA: 2  Anesthesia Plan: General   Post-op Pain Management:    Induction: Intravenous  PONV Risk Score and Plan:   Airway Management Planned: Natural Airway and Nasal Cannula  Additional Equipment:   Intra-op Plan:   Post-operative Plan:   Informed Consent: I have reviewed the patients History and Physical, chart, labs and discussed the procedure including the risks, benefits and alternatives for the proposed anesthesia with the patient or authorized representative who has indicated his/her understanding and acceptance.     Dental  Advisory Given  Plan Discussed with: Anesthesiologist, CRNA and Surgeon  Anesthesia Plan Comments: (Patient consented for risks of anesthesia including but not limited to:  - adverse reactions to medications - risk of airway placement if required - damage to eyes, teeth, lips or other oral mucosa - nerve damage due to positioning  - sore throat or hoarseness - Damage to heart, brain, nerves, lungs, other parts of body or loss of life  Patient voiced understanding and assent.)        Anesthesia Quick Evaluation

## 2023-09-10 ENCOUNTER — Ambulatory Visit: Payer: Medicare Other | Admitting: Anesthesiology

## 2023-09-10 ENCOUNTER — Other Ambulatory Visit: Payer: Self-pay

## 2023-09-10 ENCOUNTER — Ambulatory Visit
Admission: RE | Admit: 2023-09-10 | Discharge: 2023-09-10 | Disposition: A | Discharge status: Home / Self Care | Payer: Medicare Other | Attending: Gastroenterology | Admitting: Gastroenterology

## 2023-09-10 ENCOUNTER — Encounter
Admission: RE | Disposition: A | Discharge status: Home / Self Care | Payer: Self-pay | Source: Home / Self Care | Attending: Gastroenterology

## 2023-09-10 ENCOUNTER — Encounter: Payer: Self-pay | Admitting: Gastroenterology

## 2023-09-10 DIAGNOSIS — M1990 Unspecified osteoarthritis, unspecified site: Secondary | ICD-10-CM | POA: Diagnosis not present

## 2023-09-10 DIAGNOSIS — K2980 Duodenitis without bleeding: Secondary | ICD-10-CM | POA: Insufficient documentation

## 2023-09-10 DIAGNOSIS — Z794 Long term (current) use of insulin: Secondary | ICD-10-CM | POA: Diagnosis not present

## 2023-09-10 DIAGNOSIS — I10 Essential (primary) hypertension: Secondary | ICD-10-CM | POA: Insufficient documentation

## 2023-09-10 DIAGNOSIS — K5730 Diverticulosis of large intestine without perforation or abscess without bleeding: Secondary | ICD-10-CM | POA: Diagnosis not present

## 2023-09-10 DIAGNOSIS — K621 Rectal polyp: Secondary | ICD-10-CM | POA: Insufficient documentation

## 2023-09-10 DIAGNOSIS — Z1211 Encounter for screening for malignant neoplasm of colon: Secondary | ICD-10-CM

## 2023-09-10 DIAGNOSIS — K2970 Gastritis, unspecified, without bleeding: Secondary | ICD-10-CM | POA: Diagnosis not present

## 2023-09-10 DIAGNOSIS — Z7984 Long term (current) use of oral hypoglycemic drugs: Secondary | ICD-10-CM | POA: Diagnosis not present

## 2023-09-10 DIAGNOSIS — E119 Type 2 diabetes mellitus without complications: Secondary | ICD-10-CM | POA: Insufficient documentation

## 2023-09-10 DIAGNOSIS — D509 Iron deficiency anemia, unspecified: Secondary | ICD-10-CM

## 2023-09-10 DIAGNOSIS — K635 Polyp of colon: Secondary | ICD-10-CM

## 2023-09-10 DIAGNOSIS — G709 Myoneural disorder, unspecified: Secondary | ICD-10-CM | POA: Diagnosis not present

## 2023-09-10 DIAGNOSIS — K640 First degree hemorrhoids: Secondary | ICD-10-CM | POA: Insufficient documentation

## 2023-09-10 HISTORY — DX: Sciatica, left side: M54.32

## 2023-09-10 HISTORY — DX: Dizziness and giddiness: R42

## 2023-09-10 HISTORY — PX: POLYPECTOMY: SHX5525

## 2023-09-10 HISTORY — PX: ESOPHAGOGASTRODUODENOSCOPY (EGD) WITH PROPOFOL: SHX5813

## 2023-09-10 HISTORY — PX: COLONOSCOPY WITH PROPOFOL: SHX5780

## 2023-09-10 HISTORY — DX: Iron deficiency anemia, unspecified: D50.9

## 2023-09-10 LAB — GLUCOSE, CAPILLARY: Glucose-Capillary: 155 mg/dL — ABNORMAL HIGH (ref 70–99)

## 2023-09-10 SURGERY — COLONOSCOPY WITH PROPOFOL
Anesthesia: General | Site: Rectum

## 2023-09-10 MED ORDER — PROPOFOL 10 MG/ML IV BOLUS
INTRAVENOUS | Status: DC | PRN
  Administered 2023-09-10 (×5): 40 mg via INTRAVENOUS
  Administered 2023-09-10: 80 mg via INTRAVENOUS
  Administered 2023-09-10 (×3): 40 mg via INTRAVENOUS

## 2023-09-10 MED ORDER — LACTATED RINGERS IV SOLN: INTRAVENOUS | Status: DC

## 2023-09-10 MED ORDER — SODIUM CHLORIDE 0.9 % IV SOLN: INTRAVENOUS | Status: DC

## 2023-09-10 MED ORDER — EPHEDRINE 5 MG/ML INJ
INTRAVENOUS | Status: AC
  Filled 2023-09-10: qty 5

## 2023-09-10 MED ORDER — LIDOCAINE HCL (PF) 2 % IJ SOLN
INTRAMUSCULAR | Status: AC
  Filled 2023-09-10: qty 5

## 2023-09-10 MED ORDER — PHENYLEPHRINE 80 MCG/ML (10ML) SYRINGE FOR IV PUSH (FOR BLOOD PRESSURE SUPPORT)
PREFILLED_SYRINGE | INTRAVENOUS | Status: AC
  Filled 2023-09-10: qty 10

## 2023-09-10 MED ORDER — LIDOCAINE HCL (CARDIAC) PF 100 MG/5ML IV SOSY
PREFILLED_SYRINGE | INTRAVENOUS | Status: DC | PRN
  Administered 2023-09-10: 60 mg via INTRAVENOUS

## 2023-09-10 MED ORDER — PROPOFOL 10 MG/ML IV BOLUS
INTRAVENOUS | Status: AC
  Filled 2023-09-10: qty 40

## 2023-09-10 MED ORDER — STERILE WATER FOR IRRIGATION IR SOLN
Status: DC | PRN
  Administered 2023-09-10: 1

## 2023-09-10 MED ORDER — EPHEDRINE SULFATE (PRESSORS) 50 MG/ML IJ SOLN
INTRAMUSCULAR | Status: DC | PRN
  Administered 2023-09-10 (×2): 5 mg via INTRAVENOUS

## 2023-09-10 SURGICAL SUPPLY — 8 items
BLOCK BITE 60FR ADLT L/F GRN (MISCELLANEOUS) ×2 IMPLANT
FORCEPS BIOP RAD 4 LRG CAP 4 (CUTTING FORCEPS) IMPLANT
GOWN CVR UNV OPN BCK APRN NK (MISCELLANEOUS) ×4 IMPLANT
KIT PRC NS LF DISP ENDO (KITS) ×2 IMPLANT
MANIFOLD NEPTUNE II (INSTRUMENTS) ×2 IMPLANT
SNARE COLD EXACTO (MISCELLANEOUS) IMPLANT
TRAP ETRAP POLY (MISCELLANEOUS) IMPLANT
WATER STERILE IRR 250ML POUR (IV SOLUTION) ×2 IMPLANT

## 2023-09-10 NOTE — Transfer of Care (Signed)
Immediate Anesthesia Transfer of Care Note  Patient: Anthony Harvey  Procedure(s) Performed: COLONOSCOPY WITH PROPOFOL ESOPHAGOGASTRODUODENOSCOPY (EGD) WITH PROPOFOL POLYPECTOMY (Rectum)  Patient Location: PACU  Anesthesia Type: General  Level of Consciousness: awake, alert  and patient cooperative  Airway and Oxygen Therapy: Patient Spontanous Breathing and Patient connected to supplemental oxygen  Post-op Assessment: Post-op Vital signs reviewed, Patient's Cardiovascular Status Stable, Respiratory Function Stable, Patent Airway and No signs of Nausea or vomiting  Post-op Vital Signs: Reviewed and stable  Complications: No notable events documented.

## 2023-09-10 NOTE — H&P (Signed)
Anthony Minium, MD [place] [address] [place], [place] [address] Phone:[phone] Fax : [phone]  Primary Care Physician:  Anthony Mustache, MD Primary Gastroenterologist:  Dr. Servando Harvey  Pre-Procedure History & Physical: HPI:  Anthony Harvey is a 62 y.o. male is here for an endoscopy and colonoscopy.   Past Medical History:  Diagnosis Date   Arthritis    Diabetes mellitus without complication (HCC)    GERD (gastroesophageal reflux disease)    Hx of back injury 2010   fell from piping and hit back   Hyperlipidemia    Hypertension    Iron deficiency anemia    Sciatica of left side    Vertigo    rare    Past Surgical History:  Procedure Laterality Date   HERNIA REPAIR  08/03/2008   Right direct inguinal hernia, large Ultra Pro mesh.   KNEE ARTHROSCOPY Bilateral    KNEE ARTHROSCOPY Right 11/16/2016   Procedure: ARTHROSCOPY KNEE, lateral release, partial meniscectomy;  Surgeon: Anthony Bucker, MD;  Location: [facility] ORS;  Service: Orthopedics;  Laterality: Right;   TONSILLECTOMY  as child    Prior to Admission medications   Medication Sig Start Date End Date Taking? Authorizing Provider  aspirin EC 81 MG tablet Take 81 mg by mouth at bedtime.   Yes [provider]  cyanocobalamin (VITAMIN B12) 1000 MCG tablet Take 1,000 mcg by mouth daily.   Yes [provider]  docusate sodium (COLACE) 100 MG capsule Take 100 mg by mouth at bedtime.   Yes [provider]  ergocalciferol (VITAMIN D2) 1.25 MG (50000 UT) capsule Take 50,000 Units by mouth once a week. Saturday   Yes [provider]  ezetimibe (ZETIA) 10 MG tablet Take 10 mg by mouth daily.   Yes [provider]  fenofibrate (TRICOR) 145 MG tablet Take 145 mg by mouth daily. 06/14/15  Yes [provider]  glipiZIDE (GLUCOTROL XL) 5 MG 24 hr tablet Take 5 mg by mouth daily. 08/01/23  Yes [provider]  insulin glargine, 1 Unit Dial, (TOUJEO SOLOSTAR) 300 UNIT/ML  Solostar Pen Inject 30 Units into the skin daily.   Yes [provider]  lisinopril (ZESTRIL) 20 MG tablet Take 20 mg by mouth in the morning and at bedtime. 08/01/23  Yes [provider]  metFORMIN (GLUCOPHAGE) 1000 MG tablet Take 1,000 mg by mouth 2 (two) times daily with a meal.   Yes [provider]  metoprolol succinate (TOPROL-XL) 50 MG 24 hr tablet Take 50 mg by mouth daily. 06/14/15  Yes [provider]  Multiple Vitamins-Minerals (MULTIVITAMIN ADULTS 50+ PO) Take by mouth daily.   Yes [provider]  OVER THE COUNTER MEDICATION Sciatic Ease   Yes [provider]  traMADol (ULTRAM) 50 MG tablet Take 50 mg by mouth every 6 (six) hours as needed (pain).  05/31/15  Yes [provider]  traMADol (ULTRAM-ER) 300 MG 24 hr tablet Take 300 mg by mouth daily. 06/22/15  Yes [provider]  Turmeric 500 MG TABS Take by mouth daily.   Yes [provider]    Allergies as of 08/21/2023 - Review Complete 08/20/2023  Allergen Reaction Noted   Cymbalta [duloxetine hcl] Other (See Comments) 01/28/2015   Pregabalin  10/19/2016   Celebrex [celecoxib] Palpitations 06/28/2015   Niacin Hives and Rash 10/19/2016    Family History  Problem Relation Age of Onset   Cancer Mother    Diabetes Mother     Social History  Socioeconomic History   Marital status: Married    Spouse name: Not on file   Number of children: Not on file   Years of education: Not on file   Highest education level: Not on file  Occupational History   Not on file  Tobacco Use   Smoking status: Never   Smokeless tobacco: Never  Vaping Use   Vaping status: Never Used  Substance and Sexual Activity   Alcohol use: No    Alcohol/week: 0.0 standard drinks of alcohol   Drug use: No   Sexual activity: Not on file  Other Topics Concern   Not on file  Social History Narrative   Not on file   Social Determinants of Health   Financial Resource  Strain: Not on file  Food Insecurity: Not on file  Transportation Needs: Not on file  Physical Activity: Not on file  Stress: Not on file  Social Connections: Not on file  Intimate Partner Violence: Not on file    Review of Systems: See HPI, otherwise negative ROS  Physical Exam: BP (!) 150/73   Pulse 81   Temp (!) 97.2 F (36.2 C) (Temporal)   Resp (!) 22   Ht 6' (1.829 m)   Wt 111.1 kg   SpO2 99%   BMI 33.23 kg/m  General:   Alert,  pleasant and cooperative in NAD Head:  Normocephalic and atraumatic. Neck:  Supple; no masses or thyromegaly. Lungs:  Clear throughout to auscultation.    Heart:  Regular rate and rhythm. Abdomen:  Soft, nontender and nondistended. Normal bowel sounds, without guarding, and without rebound.   Neurologic:  Alert and  oriented x4;  grossly normal neurologically.  Impression/Plan: Anthony Harvey is here for an endoscopy and colonoscopy to be performed for IDA  Risks, benefits, limitations, and alternatives regarding  endoscopy and colonoscopy have been reviewed with the patient.  Questions have been answered.  All parties agreeable.   Anthony Minium, MD  09/10/2023, 7:05 AM

## 2023-09-10 NOTE — Anesthesia Postprocedure Evaluation (Signed)
Anesthesia Post Note  Patient: Anthony Harvey  Procedure(s) Performed: COLONOSCOPY WITH PROPOFOL ESOPHAGOGASTRODUODENOSCOPY (EGD) WITH PROPOFOL POLYPECTOMY (Rectum)  Patient location during evaluation: PACU Anesthesia Type: General Level of consciousness: awake and alert Pain management: pain level controlled Vital Signs Assessment: post-procedure vital signs reviewed and stable Respiratory status: spontaneous breathing, nonlabored ventilation, respiratory function stable and patient connected to nasal cannula oxygen Cardiovascular status: blood pressure returned to baseline and stable Postop Assessment: no apparent nausea or vomiting Anesthetic complications: no   No notable events documented.   Last Vitals:  Vitals:   09/10/23 0807 09/10/23 0811  BP: (!) 98/50 (!) 111/57  Pulse: 71 83  Resp: 19   Temp:    SpO2: 94% 98%    Last Pain:  Vitals:   09/10/23 0811  TempSrc:   PainSc: 0-No pain                 Marisue Humble

## 2023-09-10 NOTE — Op Note (Addendum)
[facility] Gastroenterology Patient Name: Anthony Harvey Procedure Date: 09/10/2023 7:10 AM MRN: [id number] Account #: [id number] Date of Birth: [birth year] Admit Type: Outpatient Age: 62 Room: [facility] OR ROOM 01 Gender: Male Note Status: Finalized Instrument Name: [id number] Procedure:             Upper GI endoscopy Indications:           Iron deficiency anemia Providers:             Midge Minium MD, MD Referring MD:          Sherrie Mustache, MD (Referring MD) Medicines:             Propofol per Anesthesia Complications:         No immediate complications. Procedure:             Pre-Anesthesia Assessment:                        - Prior to the procedure, a History and Physical was                         performed, and patient medications and allergies were                         reviewed. The patient's tolerance of previous                         anesthesia was also reviewed. The risks and benefits                         of the procedure and the sedation options and risks                         were discussed with the patient. All questions were                         answered, and informed consent was obtained. Prior                         Anticoagulants: The patient has taken no anticoagulant                         or antiplatelet agents. ASA Grade Assessment: II - A                         patient with mild systemic disease. After reviewing                         the risks and benefits, the patient was deemed in                         satisfactory condition to undergo the procedure.                        After obtaining informed consent, the endoscope was                         passed under direct vision. Throughout the procedure,  the patient's blood pressure, pulse, and oxygen                         saturations were monitored continuously. The Endoscope                         was introduced through the mouth, and advanced to the                          second part of duodenum. The upper GI endoscopy was                         accomplished without difficulty. The patient tolerated                         the procedure well. Findings:      The examined esophagus was normal.      Localized moderate inflammation characterized by erosions was found in       the gastric antrum. Biopsies were taken with a cold forceps for       histology.      Localized moderate inflammation characterized by erosions was found in       the duodenal bulb. Impression:            - Normal esophagus.                        - Gastritis. Biopsied.                        - Duodenitis. Recommendation:        - Discharge patient to home.                        - Resume previous diet.                        - Continue present medications.                        - Await pathology results.                        - Perform a colonoscopy today.                        - No ibuprofen, naproxen, or other non-steroidal                         anti-inflammatory drugs. Procedure Code(s):     --- Professional ---                        [phone], Esophagogastroduodenoscopy, flexible,                         transoral; with biopsy, single or multiple Diagnosis Code(s):     --- Professional ---                        D50.9, Iron deficiency anemia, unspecified  K29.70, Gastritis, unspecified, without bleeding                        K29.80, Duodenitis without bleeding CPT copyright 2022 American [employer]. All rights reserved. The codes documented in this report are preliminary and upon coder review may  be revised to meet current compliance requirements. Midge Minium MD, MD 09/10/2023 7:37:29 AM This report has been signed electronically. Number of Addenda: 0 Note Initiated On: 09/10/2023 7:10 AM Estimated Blood Loss:  Estimated blood loss: none.      [facility]

## 2023-09-10 NOTE — Op Note (Signed)
[facility] Gastroenterology Patient Name: Anthony Harvey Procedure Date: 09/10/2023 7:18 AM MRN: [id number] Account #: [id number] Date of Birth: 1960/11/16 Admit Type: Outpatient Age: 62 Room: [facility] OR ROOM 01 Gender: Male Note Status: Finalized Instrument Name: [id number] Procedure:             Colonoscopy Indications:           Iron deficiency anemia Providers:             Midge Minium MD, MD Referring MD:          Sherrie Mustache, MD (Referring MD) Medicines:             Propofol per Anesthesia Complications:         No immediate complications. Procedure:             Pre-Anesthesia Assessment:                        - Prior to the procedure, a History and Physical was                         performed, and patient medications and allergies were                         reviewed. The patient's tolerance of previous                         anesthesia was also reviewed. The risks and benefits                         of the procedure and the sedation options and risks                         were discussed with the patient. All questions were                         answered, and informed consent was obtained. Prior                         Anticoagulants: The patient has taken no anticoagulant                         or antiplatelet agents. ASA Grade Assessment: II - A                         patient with mild systemic disease. After reviewing                         the risks and benefits, the patient was deemed in                         satisfactory condition to undergo the procedure.                        - Prior to the procedure, a History and Physical was                         performed, and patient medications and allergies were  reviewed. The patient's tolerance of previous                         anesthesia was also reviewed. The risks and benefits                         of the procedure and the sedation options and risks                          were discussed with the patient. All questions were                         answered, and informed consent was obtained. Prior                         Anticoagulants: The patient has taken no anticoagulant                         or antiplatelet agents. ASA Grade Assessment: II - A                         patient with mild systemic disease. After reviewing                         the risks and benefits, the patient was deemed in                         satisfactory condition to undergo the procedure.                        After obtaining informed consent, the colonoscope was                         passed under direct vision. Throughout the procedure,                         the patient's blood pressure, pulse, and oxygen                         saturations were monitored continuously. The                         Colonoscope was introduced through the anus and                         advanced to the the cecum, identified by appendiceal                         orifice and ileocecal valve. The colonoscopy was                         performed without difficulty. The patient tolerated                         the procedure well. The quality of the bowel                         preparation was excellent. Findings:  The perianal and digital rectal examinations were normal.      A 3 mm polyp was found in the rectum. The polyp was sessile. The polyp       was removed with a cold snare. Resection and retrieval were complete.      A 6 mm polyp was found in the ascending colon. The polyp was sessile.       The polyp was removed with a cold snare. Resection and retrieval were       complete.      A few small-mouthed diverticula were found in the sigmoid colon.      Non-bleeding internal hemorrhoids were found during retroflexion. The       hemorrhoids were Grade I (internal hemorrhoids that do not prolapse). Impression:            - One 3 mm polyp in the rectum, removed with a cold                          snare. Resected and retrieved.                        - One 6 mm polyp in the ascending colon, removed with                         a cold snare. Resected and retrieved.                        - Diverticulosis in the sigmoid colon.                        - Non-bleeding internal hemorrhoids. Recommendation:        - Discharge patient to home.                        - Resume previous diet.                        - Continue present medications.                        - Await pathology results.                        - If the pathology report reveals adenomatous tissue,                         then repeat the colonoscopy for surveillance in 7                         years. Procedure Code(s):     --- Professional ---                        [phone], Colonoscopy, flexible; with removal of                         tumor(s), polyp(s), or other lesion(s) by snare                         technique Diagnosis Code(s):     --- Professional ---  D50.9, Iron deficiency anemia, unspecified                        D12.8, Benign neoplasm of rectum CPT copyright 2022 American Medical Association. All rights reserved. The codes documented in this report are preliminary and upon coder review may  be revised to meet current compliance requirements. Midge Minium MD, MD 09/10/2023 8:00:43 AM This report has been signed electronically. Number of Addenda: 0 Note Initiated On: 09/10/2023 7:18 AM Scope Withdrawal Time: 0 hours 12 minutes 11 seconds  Total Procedure Duration: 0 hours 20 minutes 12 seconds  Estimated Blood Loss:  Estimated blood loss: none.      [facility]

## 2023-09-11 ENCOUNTER — Encounter: Payer: Self-pay | Admitting: Gastroenterology

## 2023-09-12 ENCOUNTER — Telehealth: Payer: Self-pay | Admitting: Gastroenterology

## 2023-09-12 LAB — SURGICAL PATHOLOGY

## 2023-09-12 NOTE — Telephone Encounter (Signed)
Left message on voicemail Advised pt that if his Sx are not improving and/or if he develops fever, persistent abd pain, he will need to go to the ED

## 2023-09-12 NOTE — Telephone Encounter (Signed)
The patient called in requesting to speak with the nurse. The patient said since his on 09/10/23 procedure he is experiencing diarrhea, vomiting, nausea and abdominal pain.

## 2023-09-19 ENCOUNTER — Other Ambulatory Visit: Payer: Self-pay

## 2023-09-19 DIAGNOSIS — D509 Iron deficiency anemia, unspecified: Secondary | ICD-10-CM

## 2023-09-20 ENCOUNTER — Ambulatory Visit
Admission: EM | Admit: 2023-09-20 | Discharge: 2023-09-20 | Disposition: A | Discharge status: Home / Self Care | Payer: Medicare Other | Attending: Emergency Medicine | Admitting: Emergency Medicine

## 2023-09-20 ENCOUNTER — Encounter: Payer: Self-pay | Admitting: Emergency Medicine

## 2023-09-20 DIAGNOSIS — J069 Acute upper respiratory infection, unspecified: Secondary | ICD-10-CM | POA: Diagnosis present

## 2023-09-20 DIAGNOSIS — Z8679 Personal history of other diseases of the circulatory system: Secondary | ICD-10-CM | POA: Diagnosis present

## 2023-09-20 DIAGNOSIS — R030 Elevated blood-pressure reading, without diagnosis of hypertension: Secondary | ICD-10-CM

## 2023-09-20 DIAGNOSIS — H9203 Otalgia, bilateral: Secondary | ICD-10-CM | POA: Diagnosis present

## 2023-09-20 LAB — RESP PANEL BY RT-PCR (FLU A&B, COVID) ARPGX2
Influenza A by PCR: NEGATIVE
Influenza B by PCR: NEGATIVE
SARS Coronavirus 2 by RT PCR: NEGATIVE

## 2023-09-20 NOTE — Discharge Instructions (Addendum)
Your COVID and flu test are both negative , drink plenty of water.Most likely you have a viral illness, take tylenol as label directed for fever. May use Coricidin HBP for symptom management label directed.  Keep a close eye on your blood sugar as you have change in medications recently,  follow up with PCP.  If new or worsening issues or concerns go to the emergency room for further evaluation.

## 2023-09-20 NOTE — ED Triage Notes (Signed)
Pt woke up this morning with sinus pressure, headache and bilateral ear pain. Pt states he hasn't felt well in the past 4 days.

## 2023-09-20 NOTE — ED Provider Notes (Signed)
[facility]    CSN: [id number] Arrival date & time: 09/20/23  4098      History   Chief Complaint Chief Complaint  Patient presents with   sinus pressure    Otalgia    HPI Anthony Harvey is a 62 y.o. male.   62 year old male, Anthony Harvey, presents to urgent care for evaluation of sinus pressure, headache, and bilateral ear pain that started this morning.  Patient states he has not felt well for 4 days.   Past medical history of hypertension, hyperlipidemia, diabetes, GERD, arthritis, sciatica, vertigo, back injury, iron deficiency anemia  Pt states he has a new doctor and is changing his meds around for better management of BP and blood sugar, BS was 200 this am per pt report.  The history is provided by the patient. No language interpreter was used.    Past Medical History:  Diagnosis Date   Arthritis    Diabetes mellitus without complication (HCC)    GERD (gastroesophageal reflux disease)    Hx of back injury 2010   fell from piping and hit back   Hyperlipidemia    Hypertension    Iron deficiency anemia    Sciatica of left side    Vertigo    rare    Patient Active Problem List   Diagnosis Date Noted   History of hypertension 09/20/2023   Elevated blood pressure reading 09/20/2023   Viral URI 09/20/2023   Otalgia of both ears 09/20/2023   Iron deficiency anemia 09/10/2023   Gastritis without bleeding 09/10/2023   Polyp of ascending colon 09/10/2023   Groin pain 06/28/2015    Past Surgical History:  Procedure Laterality Date   COLONOSCOPY WITH PROPOFOL N/A 09/10/2023   Procedure: COLONOSCOPY WITH PROPOFOL;  Surgeon: Midge Minium, MD;  Location: [facility];  Service: Endoscopy;  Laterality: N/A;   ESOPHAGOGASTRODUODENOSCOPY (EGD) WITH PROPOFOL N/A 09/10/2023   Procedure: ESOPHAGOGASTRODUODENOSCOPY (EGD) WITH PROPOFOL;  Surgeon: Midge Minium, MD;  Location: [facility];  Service: Endoscopy;  Laterality: N/A;  Diabetic    HERNIA REPAIR  08/03/2008   Right direct inguinal hernia, large Ultra Pro mesh.   KNEE ARTHROSCOPY Bilateral    KNEE ARTHROSCOPY Right 11/16/2016   Procedure: ARTHROSCOPY KNEE, lateral release, partial meniscectomy;  Surgeon: Kennedy Bucker, MD;  Location: [facility];  Service: Orthopedics;  Laterality: Right;   POLYPECTOMY  09/10/2023   Procedure: POLYPECTOMY;  Surgeon: Midge Minium, MD;  Location: [facility];  Service: Endoscopy;;   TONSILLECTOMY  as child       Home Medications    Prior to Admission medications   Medication Sig Start Date End Date Taking? Authorizing Provider  aspirin EC 81 MG tablet Take 81 mg by mouth at bedtime.    [provider]  cyanocobalamin (VITAMIN B12) 1000 MCG tablet Take 1,000 mcg by mouth daily.    [provider]  docusate sodium (COLACE) 100 MG capsule Take 100 mg by mouth at bedtime.    [provider]  ergocalciferol (VITAMIN D2) 1.25 MG (50000 UT) capsule Take 50,000 Units by mouth once a week. Saturday    [provider]  ezetimibe (ZETIA) 10 MG tablet Take 10 mg by mouth daily.    [provider]  fenofibrate (TRICOR) 145 MG tablet Take 145 mg by mouth daily. 06/14/15   [provider]  glipiZIDE (GLUCOTROL XL) 5 MG 24 hr tablet Take 5 mg by mouth daily. 08/01/23   [provider]  insulin glargine,  1 Unit Dial, (TOUJEO SOLOSTAR) 300 UNIT/ML Solostar Pen Inject 30 Units into the skin daily.    [provider]  lisinopril (ZESTRIL) 20 MG tablet Take 20 mg by mouth in the morning and at bedtime. 08/01/23   [provider]  metFORMIN (GLUCOPHAGE) 1000 MG tablet Take 1,000 mg by mouth 2 (two) times daily with a meal.    [provider]  metoprolol succinate (TOPROL-XL) 50 MG 24 hr tablet Take 50 mg by mouth daily. 06/14/15   [provider]  Multiple Vitamins-Minerals (MULTIVITAMIN ADULTS 50+ PO) Take by mouth daily.    [provider]  OVER  THE COUNTER MEDICATION Sciatic Ease    [provider]  traMADol (ULTRAM) 50 MG tablet Take 50 mg by mouth every 6 (six) hours as needed (pain).  05/31/15   [provider]  traMADol (ULTRAM-ER) 300 MG 24 hr tablet Take 300 mg by mouth daily. 06/22/15   [provider]  Turmeric 500 MG TABS Take by mouth daily.    [provider]    Family History Family History  Problem Relation Age of Onset   Cancer Mother    Diabetes Mother     Social History Social History   Tobacco Use   Smoking status: Never   Smokeless tobacco: Never  Vaping Use   Vaping status: Never Used  Substance Use Topics   Alcohol use: No    Alcohol/week: 0.0 standard drinks of alcohol   Drug use: No     Allergies   Cymbalta [duloxetine hcl], Duloxetine, Pregabalin, Celebrex [celecoxib], and Niacin   Review of Systems Review of Systems  Constitutional:  Negative for fever.  HENT:  Positive for ear pain and sinus pressure.   Neurological:  Positive for headaches.  All other systems reviewed and are negative.    Physical Exam Triage Vital Signs ED Triage Vitals  Encounter Vitals Group     BP 09/20/23 1040 (!) 161/91     Systolic BP Percentile --      Diastolic BP Percentile --      Pulse Rate 09/20/23 1040 78     Resp 09/20/23 1040 18     Temp 09/20/23 1040 99.1 F (37.3 C)     Temp Source 09/20/23 1040 Oral     SpO2 09/20/23 1040 98 %     Weight --      Height --      Head Circumference --      Peak Flow --      Pain Score 09/20/23 1039 0     Pain Loc --      Pain Education --      Exclude from Growth Chart --    No data found.  Updated Vital Signs BP (!) 161/91 (BP Location: Left Arm)   Pulse 78   Temp 99.1 F (37.3 C) (Oral)   Resp 18   SpO2 98%   Visual Acuity Right Eye Distance:   Left Eye Distance:   Bilateral Distance:    Right Eye Near:   Left Eye Near:    Bilateral Near:     Physical Exam Vitals and nursing note reviewed.   Constitutional:      General: He is not in acute distress.    Appearance: He is well-developed. He is not ill-appearing or toxic-appearing.  HENT:     Head: Normocephalic.     Right Ear: Tympanic membrane is retracted.     Left Ear: Tympanic membrane is retracted.  Nose: Mucosal edema and congestion present.     Right Sinus: No maxillary sinus tenderness or frontal sinus tenderness.     Left Sinus: No maxillary sinus tenderness or frontal sinus tenderness.     Mouth/Throat:     Lips: Pink.     Mouth: Mucous membranes are moist.     Pharynx: Oropharynx is clear. Uvula midline.  Eyes:     General: Lids are normal.     Conjunctiva/sclera: Conjunctivae normal.     Pupils: Pupils are equal, round, and reactive to light.  Cardiovascular:     Rate and Rhythm: Normal rate and regular rhythm.     Pulses: Normal pulses.     Heart sounds: Normal heart sounds.  Pulmonary:     Effort: Pulmonary effort is normal. No respiratory distress.     Breath sounds: Normal breath sounds and air entry. No decreased breath sounds or wheezing.  Abdominal:     General: There is no distension.     Palpations: Abdomen is soft.  Musculoskeletal:        General: Normal range of motion.     Cervical back: Normal range of motion.  Skin:    General: Skin is warm and dry.     Findings: No rash.  Neurological:     General: No focal deficit present.     Mental Status: He is alert and oriented to person, place, and time.     GCS: GCS eye subscore is 4. GCS verbal subscore is 5. GCS motor subscore is 6.     Cranial Nerves: No cranial nerve deficit.     Sensory: No sensory deficit.  Psychiatric:        Speech: Speech normal.        Behavior: Behavior normal. Behavior is cooperative.      UC Treatments / Results  Labs (all labs ordered are listed, but only abnormal results are displayed) Labs Reviewed  RESP PANEL BY RT-PCR (FLU A&B, COVID) ARPGX2    EKG   Radiology No results  found.  Procedures Procedures (including critical care time)  Medications Ordered in UC Medications - No data to display  Initial Impression / Assessment and Plan / UC Course  I have reviewed the triage vital signs and the nursing notes.  Pertinent labs & imaging results that were available during my care of the patient were reviewed by me and considered in my medical decision making (see chart for details).    Discussed exam findings and plan of care with patient, strict go to ER precautions given.   Patient verbalized understanding to this provider.  Ddx: Viral URI, otalgia of both ears, history of hypertension, elevated blood pressure reading Final Clinical Impressions(s) / UC Diagnoses   Final diagnoses:  History of hypertension  Elevated blood pressure reading  Viral URI  Otalgia of both ears     Discharge Instructions      Your COVID and flu test are both negative , drink plenty of water.Most likely you have a viral illness, take tylenol as label directed for fever. May use Coricidin HBP for symptom management label directed.  Keep a close eye on your blood sugar as you have change in medications recently,  follow up with PCP.  If new or worsening issues or concerns go to the emergency room for further evaluation.     ED Prescriptions   None    PDMP not reviewed this encounter.   Clancy Gourd, NP 09/20/23 1229

## 2023-09-26 ENCOUNTER — Encounter: Payer: Self-pay | Admitting: Anesthesiology

## 2023-09-26 ENCOUNTER — Encounter: Payer: Self-pay | Admitting: Gastroenterology

## 2023-09-26 ENCOUNTER — Encounter
Admission: RE | Disposition: A | Discharge status: Home / Self Care | Payer: Self-pay | Source: Home / Self Care | Attending: Gastroenterology

## 2023-09-26 ENCOUNTER — Ambulatory Visit
Admission: RE | Admit: 2023-09-26 | Discharge: 2023-09-26 | Disposition: A | Discharge status: Home / Self Care | Payer: Medicare Other | Attending: Gastroenterology | Admitting: Gastroenterology

## 2023-09-26 DIAGNOSIS — D509 Iron deficiency anemia, unspecified: Secondary | ICD-10-CM | POA: Diagnosis present

## 2023-09-26 HISTORY — PX: GIVENS CAPSULE STUDY: SHX5432

## 2023-09-26 SURGERY — GIVENS CAPSULE STUDY

## 2023-09-27 ENCOUNTER — Encounter: Payer: Self-pay | Admitting: Gastroenterology

## 2023-10-22 ENCOUNTER — Telehealth: Payer: Self-pay

## 2023-10-22 ENCOUNTER — Ambulatory Visit
Admission: EM | Admit: 2023-10-22 | Discharge: 2023-10-22 | Disposition: A | Discharge status: Home / Self Care | Payer: Medicare Other

## 2023-10-22 DIAGNOSIS — J069 Acute upper respiratory infection, unspecified: Secondary | ICD-10-CM

## 2023-10-22 MED ORDER — IPRATROPIUM BROMIDE 0.06 % NA SOLN
2.0000 | Freq: Four times a day (QID) | NASAL | 12 refills | Status: DC
Start: 2023-10-22 — End: 2024-06-27

## 2023-10-22 MED ORDER — AMOXICILLIN-POT CLAVULANATE 875-125 MG PO TABS: 1.0000 | ORAL_TABLET | Freq: Two times a day (BID) | ORAL | 0 refills | Status: AC

## 2023-10-22 MED ORDER — PROMETHAZINE-DM 6.25-15 MG/5ML PO SYRP: 5.0000 mL | ORAL_SOLUTION | Freq: Four times a day (QID) | ORAL | 0 refills | Status: DC | PRN

## 2023-10-22 MED ORDER — BENZONATATE 100 MG PO CAPS: 200.0000 mg | ORAL_CAPSULE | Freq: Three times a day (TID) | ORAL | 0 refills | Status: DC

## 2023-10-22 NOTE — Telephone Encounter (Signed)
 Patient is calling to get results of capsule study he had done on 09/25/2024

## 2023-10-22 NOTE — ED Triage Notes (Signed)
 Pt c/o sinus pressure & cough x10 days. Has tried coricidin & nyquil w/o relief.

## 2023-10-22 NOTE — ED Provider Notes (Signed)
 [facility]    CSN: [id number] Arrival date & time: 10/22/23  1700      History   Chief Complaint Chief Complaint  Patient presents with   Sinus Problem   Cough    HPI Anthony Harvey is a 63 y.o. male.   HPI  63 year old male with past medical history significant for hypertension, hyperlipidemia, GERD, diabetes, arthritis, and IDA who presents for evaluation of 10 days worth of respiratory symptoms to include nasal congestion and sinus pressure with green nasal discharge, ear pain, and a cough that is productive in the morning.  He reports that he has seen some blood-tinged sputum that he is coughed up in the mornings.  Throughout the day the cough is nonproductive.  No associated fever, shortness breath, or wheezing.  Past Medical History:  Diagnosis Date   Arthritis    Diabetes mellitus without complication (HCC)    GERD (gastroesophageal reflux disease)    Hx of back injury 2010   fell from piping and hit back   Hyperlipidemia    Hypertension    Iron deficiency anemia    Sciatica of left side    Vertigo    rare    Patient Active Problem List   Diagnosis Date Noted   History of hypertension 09/20/2023   Elevated blood pressure reading 09/20/2023   Viral URI 09/20/2023   Otalgia of both ears 09/20/2023   Iron deficiency anemia 09/10/2023   Gastritis without bleeding 09/10/2023   Polyp of ascending colon 09/10/2023   Groin pain 06/28/2015    Past Surgical History:  Procedure Laterality Date   COLONOSCOPY WITH PROPOFOL  N/A 09/10/2023   Procedure: COLONOSCOPY WITH PROPOFOL ;  Surgeon: Jinny Carmine, MD;  Location: [facility];  Service: Endoscopy;  Laterality: N/A;   ESOPHAGOGASTRODUODENOSCOPY (EGD) WITH PROPOFOL  N/A 09/10/2023   Procedure: ESOPHAGOGASTRODUODENOSCOPY (EGD) WITH PROPOFOL ;  Surgeon: Jinny Carmine, MD;  Location: [facility];  Service: Endoscopy;  Laterality: N/A;  Diabetic   GIVENS CAPSULE STUDY N/A 09/26/2023    Procedure: GIVENS CAPSULE STUDY;  Surgeon: Jinny Carmine, MD;  Location: [facility];  Service: Endoscopy;  Laterality: N/A;   HERNIA REPAIR  08/03/2008   Right direct inguinal hernia, large Ultra Pro mesh.   KNEE ARTHROSCOPY Bilateral    KNEE ARTHROSCOPY Right 11/16/2016   Procedure: ARTHROSCOPY KNEE, lateral release, partial meniscectomy;  Surgeon: Ozell Flake, MD;  Location: [facility];  Service: Orthopedics;  Laterality: Right;   POLYPECTOMY  09/10/2023   Procedure: POLYPECTOMY;  Surgeon: Jinny Carmine, MD;  Location: [facility];  Service: Endoscopy;;   TONSILLECTOMY  as child       Home Medications    Prior to Admission medications   Medication Sig Start Date End Date Taking? Authorizing Provider  amoxicillin -clavulanate (AUGMENTIN ) 875-125 MG tablet Take 1 tablet by mouth every 12 (twelve) hours for 7 days. 10/22/23 10/29/23 Yes Bernardino Ditch, NP  aspirin  EC 81 MG tablet Take 81 mg by mouth at bedtime.   Yes [provider]  benzonatate  (TESSALON ) 100 MG capsule Take 2 capsules (200 mg total) by mouth every 8 (eight) hours. 10/22/23  Yes Bernardino Ditch, NP  cyanocobalamin  (VITAMIN B12) 1000 MCG tablet Take 1,000 mcg by mouth daily.   Yes [provider]  docusate sodium  (COLACE) 100 MG capsule Take 100 mg by mouth at bedtime.   Yes [provider]  ergocalciferol  (VITAMIN D2) 1.25 MG (50000 UT) capsule Take 50,000 Units by mouth once a week. Saturday   Yes  [provider]  ezetimibe  (ZETIA ) 10 MG tablet Take 10 mg by mouth daily.   Yes [provider]  FARXIGA  10 MG TABS tablet Take 10 mg by mouth daily. 06/29/23  Yes [provider]  fenofibrate  (TRICOR ) 145 MG tablet Take 145 mg by mouth daily. 06/14/15  Yes [provider]  glipiZIDE  (GLUCOTROL  XL) 5 MG 24 hr tablet Take 5 mg by mouth daily. 08/01/23  Yes [provider]  hydrALAZINE  (APRESOLINE ) 25 MG tablet Take 25 mg by mouth 2 (two) times daily. 10/08/23   Yes [provider]  insulin  glargine, 1 Unit Dial, (TOUJEO  SOLOSTAR) 300 UNIT/ML Solostar Pen Inject 30 Units into the skin daily.   Yes [provider]  ipratropium (ATROVENT ) 0.06 % nasal spray Place 2 sprays into both nostrils 4 (four) times daily. 10/22/23  Yes Bernardino Ditch, NP  lisinopril  (ZESTRIL ) 20 MG tablet Take 20 mg by mouth in the morning and at bedtime. 08/01/23  Yes [provider]  metFORMIN  (GLUCOPHAGE ) 1000 MG tablet Take 1,000 mg by mouth 2 (two) times daily with a meal.   Yes [provider]  metoprolol  succinate (TOPROL -XL) 50 MG 24 hr tablet Take 50 mg by mouth daily. 06/14/15  Yes [provider]  Multiple Vitamins-Minerals (MULTIVITAMIN ADULTS 50+ PO) Take by mouth daily.   Yes [provider]  OVER THE COUNTER MEDICATION Sciatic Ease   Yes [provider]  promethazine -dextromethorphan (PROMETHAZINE -DM) 6.25-15 MG/5ML syrup Take 5 mLs by mouth 4 (four) times daily as needed. 10/22/23  Yes Bernardino Ditch, NP  traMADol  (ULTRAM ) 50 MG tablet Take 50 mg by mouth every 6 (six) hours as needed (pain).  05/31/15  Yes [provider]  traMADol  (ULTRAM -ER) 300 MG 24 hr tablet Take 300 mg by mouth daily. 06/22/15  Yes [provider]  Turmeric 500 MG TABS Take by mouth daily.   Yes [provider]    Family History Family History  Problem Relation Age of Onset   Cancer Mother    Diabetes Mother     Social History Social History   Tobacco Use   Smoking status: Never   Smokeless tobacco: Never  Vaping Use   Vaping status: Never Used  Substance Use Topics   Alcohol use: No    Alcohol/week: 0.0 standard drinks of alcohol   Drug use: No     Allergies   Cymbalta [duloxetine hcl], Duloxetine, Pregabalin, Celebrex [celecoxib], and Niacin   Review of Systems Review of Systems  Constitutional:  Negative for fever.  HENT:  Positive for congestion, ear pain, rhinorrhea and sinus pressure.    Respiratory:  Positive for cough. Negative for shortness of breath and wheezing.      Physical Exam Triage Vital Signs ED Triage Vitals [10/22/23 1758]  Encounter Vitals Group     BP      Systolic BP Percentile      Diastolic BP Percentile      Pulse      Resp 16     Temp      Temp Source Oral     SpO2      Weight      Height      Head Circumference      Peak Flow      Pain Score      Pain Loc      Pain Education      Exclude from Growth Chart    No data found.  Updated Vital Signs BP ROLLEN)  158/82 (BP Location: Left Arm)   Pulse 80   Temp 98.7 F (37.1 C) (Oral)   Resp 16   Ht 6' (1.829 m)   Wt 245 lb (111.1 kg)   SpO2 99%   BMI 33.23 kg/m   Visual Acuity Right Eye Distance:   Left Eye Distance:   Bilateral Distance:    Right Eye Near:   Left Eye Near:    Bilateral Near:     Physical Exam Vitals and nursing note reviewed.  Constitutional:      Appearance: Normal appearance. He is not ill-appearing.  HENT:     Head: Normocephalic and atraumatic.     Right Ear: Tympanic membrane, ear canal and external ear normal. There is no impacted cerumen.     Left Ear: Tympanic membrane, ear canal and external ear normal. There is no impacted cerumen.     Nose: Congestion and rhinorrhea present.     Comments: Nasal mucosa is erythematous and edematous with thick yellow discharge in both nares.  Frontal and maxillary sinuses are nontender to compression.    Mouth/Throat:     Mouth: Mucous membranes are moist.     Pharynx: Oropharynx is clear. No oropharyngeal exudate or posterior oropharyngeal erythema.  Cardiovascular:     Rate and Rhythm: Normal rate and regular rhythm.     Pulses: Normal pulses.     Heart sounds: Normal heart sounds. No murmur heard.    No friction rub. No gallop.  Pulmonary:     Effort: Pulmonary effort is normal.     Breath sounds: Normal breath sounds. No wheezing, rhonchi or rales.  Musculoskeletal:     Cervical back: Normal range of  motion and neck supple. No tenderness.  Lymphadenopathy:     Cervical: No cervical adenopathy.  Skin:    General: Skin is warm and dry.     Capillary Refill: Capillary refill takes less than 2 seconds.     Findings: No rash.  Neurological:     General: No focal deficit present.     Mental Status: He is alert and oriented to person, place, and time.      UC Treatments / Results  Labs (all labs ordered are listed, but only abnormal results are displayed) Labs Reviewed - No data to display  EKG   Radiology No results found.  Procedures Procedures (including critical care time)  Medications Ordered in UC Medications - No data to display  Initial Impression / Assessment and Plan / UC Course  I have reviewed the triage vital signs and the nursing notes.  Pertinent labs & imaging results that were available during my care of the patient were reviewed by me and considered in my medical decision making (see chart for details).   Patient is a nontoxic-appearing 63 year old male presenting for evaluation of 10 days worth of respiratory symptoms as outlined HPI above.  His physical exam does reveal inflammation of his upper respiratory tract with purulent discharge in both nares.  He has no tenderness to compression of bilateral maxillary frontal sinuses.  Oropharyngeal exam is benign.  Cardiopulmonary exam reveals clear lung sounds in all fields.  Patient exam is consistent with an upper respiratory infection.  Given the fact that he has been experiencing symptoms for 10 days a trial of antibiotics is warranted.  I will put him on Augmentin  875 mg twice daily for 7 days for treatment of his URI.  Atrovent  nasal spray to help with nasal congestion along with Tessalon  Perles and  Promethazine  DM cough syrup for cough and congestion.  Return precautions reviewed.   Final Clinical Impressions(s) / UC Diagnoses   Final diagnoses:  URI with cough and congestion     Discharge Instructions       Take the Augmentin  875 mg twice daily with food for 7 days for treatment of your respiratory infection.  Use over-the-counter Tylenol  and/or ibuprofen according the package instructions as needed for any fever or pain.  Use the Atrovent  nasal spray, 2 squirts in each nostril every 6 hours, as needed for runny nose and postnasal drip.  Use the Tessalon  Perles every 8 hours during the day.  Take them with a small sip of water .  They may give you some numbness to the base of your tongue or a metallic taste in your mouth, this is normal.  Use the Promethazine  DM cough syrup at bedtime for cough and congestion.  It will make you drowsy so do not take it during the day.  Return for reevaluation or see your primary care provider for any new or worsening symptoms.      ED Prescriptions     Medication Sig Dispense Auth. Provider   amoxicillin -clavulanate (AUGMENTIN ) 875-125 MG tablet Take 1 tablet by mouth every 12 (twelve) hours for 7 days. 14 tablet Bernardino Ditch, NP   benzonatate  (TESSALON ) 100 MG capsule Take 2 capsules (200 mg total) by mouth every 8 (eight) hours. 21 capsule Bernardino Ditch, NP   ipratropium (ATROVENT ) 0.06 % nasal spray Place 2 sprays into both nostrils 4 (four) times daily. 15 mL Bernardino Ditch, NP   promethazine -dextromethorphan (PROMETHAZINE -DM) 6.25-15 MG/5ML syrup Take 5 mLs by mouth 4 (four) times daily as needed. 118 mL Bernardino Ditch, NP      PDMP not reviewed this encounter.   Bernardino Ditch, NP 10/22/23 1821

## 2023-10-22 NOTE — Discharge Instructions (Addendum)
 Take the Augmentin  875 mg twice daily with food for 7 days for treatment of your respiratory infection.  Use over-the-counter Tylenol  and/or ibuprofen according the package instructions as needed for any fever or pain.  Use the Atrovent  nasal spray, 2 squirts in each nostril every 6 hours, as needed for runny nose and postnasal drip.  Use the Tessalon  Perles every 8 hours during the day.  Take them with a small sip of water .  They may give you some numbness to the base of your tongue or a metallic taste in your mouth, this is normal.  Use the Promethazine  DM cough syrup at bedtime for cough and congestion.  It will make you drowsy so do not take it during the day.  Return for reevaluation or see your primary care provider for any new or worsening symptoms.

## 2023-10-23 NOTE — Telephone Encounter (Signed)
 I let pt know the Dr who reads the report has been on vacation and I will call him back once he returns with the results

## 2023-11-07 NOTE — Telephone Encounter (Signed)
Pt is aware of results and expressed understanding.

## 2023-12-19 ENCOUNTER — Encounter: Payer: Self-pay | Admitting: Emergency Medicine

## 2023-12-19 ENCOUNTER — Emergency Department

## 2023-12-19 ENCOUNTER — Observation Stay
Admission: EM | Admit: 2023-12-19 | Discharge: 2023-12-20 | Disposition: A | Discharge status: Home / Self Care | Attending: Internal Medicine | Admitting: Internal Medicine

## 2023-12-19 ENCOUNTER — Other Ambulatory Visit: Payer: Self-pay

## 2023-12-19 DIAGNOSIS — E876 Hypokalemia: Secondary | ICD-10-CM | POA: Diagnosis not present

## 2023-12-19 DIAGNOSIS — R079 Chest pain, unspecified: Secondary | ICD-10-CM | POA: Diagnosis present

## 2023-12-19 DIAGNOSIS — I10 Essential (primary) hypertension: Secondary | ICD-10-CM | POA: Insufficient documentation

## 2023-12-19 DIAGNOSIS — Z7984 Long term (current) use of oral hypoglycemic drugs: Secondary | ICD-10-CM | POA: Insufficient documentation

## 2023-12-19 DIAGNOSIS — Z7982 Long term (current) use of aspirin: Secondary | ICD-10-CM | POA: Diagnosis not present

## 2023-12-19 DIAGNOSIS — R29898 Other symptoms and signs involving the musculoskeletal system: Secondary | ICD-10-CM | POA: Diagnosis not present

## 2023-12-19 DIAGNOSIS — R0789 Other chest pain: Secondary | ICD-10-CM | POA: Diagnosis present

## 2023-12-19 DIAGNOSIS — Z79899 Other long term (current) drug therapy: Secondary | ICD-10-CM | POA: Diagnosis not present

## 2023-12-19 DIAGNOSIS — Z794 Long term (current) use of insulin: Secondary | ICD-10-CM | POA: Insufficient documentation

## 2023-12-19 DIAGNOSIS — G459 Transient cerebral ischemic attack, unspecified: Secondary | ICD-10-CM | POA: Diagnosis not present

## 2023-12-19 DIAGNOSIS — E119 Type 2 diabetes mellitus without complications: Secondary | ICD-10-CM | POA: Insufficient documentation

## 2023-12-19 LAB — CBC WITH DIFFERENTIAL/PLATELET
Abs Immature Granulocytes: 0.02 10*3/uL (ref 0.00–0.07)
Basophils Absolute: 0 10*3/uL (ref 0.0–0.1)
Basophils Relative: 0 %
Eosinophils Absolute: 0.1 10*3/uL (ref 0.0–0.5)
Eosinophils Relative: 1 %
HCT: 41.1 % (ref 39.0–52.0)
Hemoglobin: 14 g/dL (ref 13.0–17.0)
Immature Granulocytes: 0 %
Lymphocytes Relative: 69 %
Lymphs Abs: 8.5 10*3/uL — ABNORMAL HIGH (ref 0.7–4.0)
MCH: 28.6 pg (ref 26.0–34.0)
MCHC: 34.1 g/dL (ref 30.0–36.0)
MCV: 83.9 fL (ref 80.0–100.0)
Monocytes Absolute: 0.5 10*3/uL (ref 0.1–1.0)
Monocytes Relative: 4 %
Neutro Abs: 3.2 10*3/uL (ref 1.7–7.7)
Neutrophils Relative %: 26 %
Platelets: 178 10*3/uL (ref 150–400)
RBC: 4.9 MIL/uL (ref 4.22–5.81)
RDW: 14.3 % (ref 11.5–15.5)
Smear Review: NORMAL
WBC: 12.2 10*3/uL — ABNORMAL HIGH (ref 4.0–10.5)
nRBC: 0 % (ref 0.0–0.2)

## 2023-12-19 LAB — COMPREHENSIVE METABOLIC PANEL
ALT: 29 U/L (ref 0–44)
AST: 26 U/L (ref 15–41)
Albumin: 4.4 g/dL (ref 3.5–5.0)
Alkaline Phosphatase: 39 U/L (ref 38–126)
Anion gap: 12 (ref 5–15)
BUN: 18 mg/dL (ref 8–23)
CO2: 22 mmol/L (ref 22–32)
Calcium: 9.3 mg/dL (ref 8.9–10.3)
Chloride: 103 mmol/L (ref 98–111)
Creatinine, Ser: 1.25 mg/dL — ABNORMAL HIGH (ref 0.61–1.24)
GFR, Estimated: >60 mL/min (ref >60)
Glucose, Bld: 264 mg/dL — ABNORMAL HIGH (ref 70–99)
Potassium: 3.4 mmol/L — ABNORMAL LOW (ref 3.5–5.1)
Sodium: 137 mmol/L (ref 135–145)
Total Bilirubin: 0.5 mg/dL (ref 0.0–1.2)
Total Protein: 6.6 g/dL (ref 6.5–8.1)

## 2023-12-19 LAB — LIPASE, BLOOD: Lipase: 28 U/L (ref 11–51)

## 2023-12-19 LAB — TROPONIN I (HIGH SENSITIVITY): Troponin I (High Sensitivity): 10 ng/L (ref <18)

## 2023-12-19 MED ORDER — IOHEXOL 350 MG/ML SOLN
100.0000 mL | Freq: Once | INTRAVENOUS | Status: AC | PRN
  Administered 2023-12-19: 100 mL via INTRAVENOUS

## 2023-12-19 NOTE — ED Provider Notes (Signed)
 [facility] Provider Note    Event Date/Time   First MD Initiated Contact with Patient 12/19/23 2138     (approximate)   History   Chest Pain and Numbness   HPI  Anthony Harvey is a 63 y.o. male with a history of diabetes, GERD, hypertension, hyperlipidemia who presents to the ER for evaluation of sudden onset of chest pain radiating to left arm and at the same time developed numbness and tingling of the entire left side of his body.  This occurred while he was eating dinner.  He denies any nausea or vomiting.  No epigastric pain.  Denies any headache.  He is feeling better.  Denies any history of ACS or heart disease.     Physical Exam   Triage Vital Signs: ED Triage Vitals  Encounter Vitals Group     BP 12/19/23 2145 (!) 158/96     Systolic BP Percentile --      Diastolic BP Percentile --      Pulse Rate 12/19/23 2145 (!) 101     Resp 12/19/23 2145 20     Temp 12/19/23 2145 98.8 F (37.1 C)     Temp Source 12/19/23 2145 Oral     SpO2 12/19/23 2145 96 %     Weight 12/19/23 2146 244 lb 11.4 oz (111 kg)     Height 12/19/23 2146 6' (1.829 m)     Head Circumference --      Peak Flow --      Pain Score 12/19/23 2146 5     Pain Loc --      Pain Education --      Exclude from Growth Chart --     Most recent vital signs: Vitals:   12/19/23 2230 12/19/23 2300  BP: (!) 170/97 (!) 144/81  Pulse: 88 87  Resp:  18  Temp:    SpO2: 96% 95%     Constitutional: Alert  Eyes: Conjunctivae are normal.  Head: Atraumatic. Nose: No congestion/rhinnorhea. Mouth/Throat: Mucous membranes are moist.   Neck: Painless ROM.  Cardiovascular:   Good peripheral circulation. Respiratory: Normal respiratory effort.  No retractions.  Gastrointestinal: Soft and nontender.  Musculoskeletal:  no deformity Neurologic:  MAE spontaneously. No gross focal neurologic deficits are appreciated.  Skin:  Skin is warm, dry and intact. No rash noted. Psychiatric: Mood and  affect are normal. Speech and behavior are normal.    ED Results / Procedures / Treatments   Labs (all labs ordered are listed, but only abnormal results are displayed) Labs Reviewed  CBC WITH DIFFERENTIAL/PLATELET - Abnormal; Notable for the following components:      Result Value   WBC 12.2 (*)    Lymphs Abs 8.5 (*)    All other components within normal limits  COMPREHENSIVE METABOLIC PANEL - Abnormal; Notable for the following components:   Potassium 3.4 (*)    Glucose, Bld 264 (*)    Creatinine, Ser 1.25 (*)    All other components within normal limits  LIPASE, BLOOD  PATHOLOGIST SMEAR REVIEW  TROPONIN I (HIGH SENSITIVITY)  TROPONIN I (HIGH SENSITIVITY)     EKG  ED ECG REPORT I, Willy Eddy, the attending physician, personally viewed and interpreted this ECG.   Date: 12/19/2023  EKG Time: 21:54  Rate: 95  Rhythm: sinus  Axis: normal  Intervals: normal  ST&T Change: no stemi, no depressions    RADIOLOGY Please see ED Course for my review and interpretation.  I personally reviewed  all radiographic images ordered to evaluate for the above acute complaints and reviewed radiology reports and findings.  These findings were personally discussed with the patient.  Please see medical record for radiology report.    PROCEDURES:  Critical Care performed:   Procedures   MEDICATIONS ORDERED IN ED: Medications  iohexol (OMNIPAQUE) 350 MG/ML injection 100 mL (100 mLs Intravenous Contrast Given 12/19/23 2320)     IMPRESSION / MDM / ASSESSMENT AND PLAN / ED COURSE  I reviewed the triage vital signs and the nursing notes.                              Differential diagnosis includes, but is not limited to, ACS, pericarditis, esophagitis, boerhaaves, pe, dissection, pna, bronchitis, costochondritis  Patient presenting to the ER for evaluation of symptoms as described above.  Based on symptoms, risk factors and considered above differential, this presenting  complaint could reflect a potentially life-threatening illness therefore the patient will be placed on continuous pulse oximetry and telemetry for monitoring.  Laboratory evaluation will be sent to evaluate for the above complaints.      Clinical Course as of 12/19/23 2334  Wed Dec 19, 2023  2310 Patient is nontoxic-appearing have ordered CTA to rule out dissection and other intrathoracic process.  I also ordered CTA of the head to evaluate for acute intracranial process.  Patient be signed out oncoming physician pending follow-up imaging as well as repeat troponins. [PR]    Clinical Course User Index [PR] Willy Eddy, MD     FINAL CLINICAL IMPRESSION(S) / ED DIAGNOSES   Final diagnoses:  Atypical chest pain     Rx / DC Orders   ED Discharge Orders     None        Note:  This document was prepared using Dragon voice recognition software and may include unintentional dictation errors.    Willy Eddy, MD 12/19/23 [phone]

## 2023-12-19 NOTE — ED Triage Notes (Signed)
 Patient brought in by [facility] sudden onset of left sided chest pain and left arm and leg numbness started at approximately 0830pm.  EMS gave 324mg  ASA ande 1inch of nitro

## 2023-12-19 NOTE — ED Notes (Signed)
 Attempted IV access x 2 without success.

## 2023-12-20 ENCOUNTER — Observation Stay

## 2023-12-20 ENCOUNTER — Observation Stay
Admit: 2023-12-20 | Discharge: 2023-12-20 | Disposition: A | Discharge status: Home / Self Care | Attending: Internal Medicine | Admitting: Internal Medicine

## 2023-12-20 DIAGNOSIS — R079 Chest pain, unspecified: Secondary | ICD-10-CM | POA: Diagnosis not present

## 2023-12-20 LAB — TROPONIN I (HIGH SENSITIVITY): Troponin I (High Sensitivity): 10 ng/L (ref <18)

## 2023-12-20 LAB — LIPID PANEL
Cholesterol: 176 mg/dL (ref 0–200)
HDL: 30 mg/dL — ABNORMAL LOW (ref >40)
LDL Cholesterol: 74 mg/dL (ref 0–99)
Total CHOL/HDL Ratio: 5.9 ratio
Triglycerides: 362 mg/dL — ABNORMAL HIGH (ref <150)
VLDL: 72 mg/dL — ABNORMAL HIGH (ref 0–40)

## 2023-12-20 LAB — HEMOGLOBIN A1C
Hgb A1c MFr Bld: 7.5 % — ABNORMAL HIGH (ref 4.8–5.6)
Mean Plasma Glucose: 168.55 mg/dL

## 2023-12-20 LAB — CBC
HCT: 39.4 % (ref 39.0–52.0)
Hemoglobin: 13.4 g/dL (ref 13.0–17.0)
MCH: 28.6 pg (ref 26.0–34.0)
MCHC: 34 g/dL (ref 30.0–36.0)
MCV: 84 fL (ref 80.0–100.0)
Platelets: 195 10*3/uL (ref 150–400)
RBC: 4.69 MIL/uL (ref 4.22–5.81)
RDW: 14.4 % (ref 11.5–15.5)
WBC: 12.7 10*3/uL — ABNORMAL HIGH (ref 4.0–10.5)
nRBC: 0.2 % (ref 0.0–0.2)

## 2023-12-20 LAB — ECHOCARDIOGRAM COMPLETE
AR max vel: 3.39 cm2
AV Area VTI: 4.44 cm2
AV Area mean vel: 3.75 cm2
AV Mean grad: 2 mmHg
AV Peak grad: 5.6 mmHg
Ao pk vel: 1.18 m/s
Area-P 1/2: 3.26 cm2
Calc EF: 58.5 %
Height: 72 in
MV VTI: 3.22 cm2
S' Lateral: 3.1 cm
Single Plane A2C EF: 48.9 %
Single Plane A4C EF: 65.6 %
Weight: 3915.37 [oz_av]

## 2023-12-20 LAB — CBG MONITORING, ED
Glucose-Capillary: 142 mg/dL — ABNORMAL HIGH (ref 70–99)
Glucose-Capillary: 125 mg/dL — ABNORMAL HIGH (ref 70–99)

## 2023-12-20 LAB — CREATININE, SERUM
Creatinine, Ser: 1.11 mg/dL (ref 0.61–1.24)
GFR, Estimated: >60 mL/min (ref >60)

## 2023-12-20 LAB — TSH: TSH: 5.757 u[IU]/mL — ABNORMAL HIGH (ref 0.350–4.500)

## 2023-12-20 LAB — HIV ANTIBODY (ROUTINE TESTING W REFLEX): HIV Screen 4th Generation wRfx: NONREACTIVE

## 2023-12-20 LAB — PATHOLOGIST SMEAR REVIEW

## 2023-12-20 MED ORDER — TRAMADOL HCL 50 MG PO TABS: 50.0000 mg | ORAL_TABLET | Freq: Four times a day (QID) | ORAL | Status: DC | PRN

## 2023-12-20 MED ORDER — CYCLOBENZAPRINE HCL 10 MG PO TABS
5.0000 mg | ORAL_TABLET | Freq: Once | ORAL | Status: AC
  Administered 2023-12-20: 5 mg via ORAL
  Filled 2023-12-20: qty 1

## 2023-12-20 MED ORDER — STROKE: EARLY STAGES OF RECOVERY BOOK: Freq: Once | Status: DC

## 2023-12-20 MED ORDER — TRAZODONE HCL 50 MG PO TABS: 25.0000 mg | ORAL_TABLET | Freq: Every evening | ORAL | Status: DC | PRN

## 2023-12-20 MED ORDER — METFORMIN HCL 500 MG PO TABS: 1000.0000 mg | ORAL_TABLET | Freq: Two times a day (BID) | ORAL | Status: DC

## 2023-12-20 MED ORDER — GLIPIZIDE ER 5 MG PO TB24
5.0000 mg | ORAL_TABLET | Freq: Every day | ORAL | Status: DC
  Administered 2023-12-20: 5 mg via ORAL
  Filled 2023-12-20: qty 1

## 2023-12-20 MED ORDER — ENOXAPARIN SODIUM 60 MG/0.6ML IJ SOSY
55.0000 mg | PREFILLED_SYRINGE | INTRAMUSCULAR | Status: DC
  Administered 2023-12-20: 55 mg via SUBCUTANEOUS
  Filled 2023-12-20: qty 0.6

## 2023-12-20 MED ORDER — METOPROLOL SUCCINATE ER 50 MG PO TB24
50.0000 mg | ORAL_TABLET | Freq: Every day | ORAL | Status: DC
  Administered 2023-12-20: 50 mg via ORAL
  Filled 2023-12-20: qty 1

## 2023-12-20 MED ORDER — ACETAMINOPHEN 650 MG RE SUPP: 650.0000 mg | RECTAL | Status: DC | PRN

## 2023-12-20 MED ORDER — DOCUSATE SODIUM 100 MG PO CAPS: 100.0000 mg | ORAL_CAPSULE | Freq: Every day | ORAL | Status: DC

## 2023-12-20 MED ORDER — LORAZEPAM 2 MG/ML IJ SOLN: 0.5000 mg | Freq: Four times a day (QID) | INTRAMUSCULAR | Status: DC | PRN

## 2023-12-20 MED ORDER — INSULIN ASPART 100 UNIT/ML IJ SOLN
0.0000 [IU] | Freq: Three times a day (TID) | INTRAMUSCULAR | Status: DC
  Administered 2023-12-20: 2 [IU] via SUBCUTANEOUS
  Filled 2023-12-20: qty 1

## 2023-12-20 MED ORDER — ASPIRIN 81 MG PO TBEC
81.0000 mg | DELAYED_RELEASE_TABLET | Freq: Every day | ORAL | Status: DC
Start: 2023-12-20 — End: 2023-12-20

## 2023-12-20 MED ORDER — TRAMADOL HCL ER 300 MG PO TB24: 300.0000 mg | ORAL_TABLET | Freq: Every day | ORAL | Status: DC

## 2023-12-20 MED ORDER — SODIUM CHLORIDE 0.9 % IV SOLN: INTRAVENOUS | Status: DC

## 2023-12-20 MED ORDER — EZETIMIBE 10 MG PO TABS
10.0000 mg | ORAL_TABLET | Freq: Every day | ORAL | Status: DC
  Administered 2023-12-20: 10 mg via ORAL
  Filled 2023-12-20: qty 1

## 2023-12-20 MED ORDER — SENNOSIDES-DOCUSATE SODIUM 8.6-50 MG PO TABS: 1.0000 | ORAL_TABLET | Freq: Every evening | ORAL | Status: DC | PRN

## 2023-12-20 MED ORDER — ACETAMINOPHEN 325 MG PO TABS
650.0000 mg | ORAL_TABLET | ORAL | Status: DC | PRN
  Administered 2023-12-20: 650 mg via ORAL
  Filled 2023-12-20: qty 2

## 2023-12-20 MED ORDER — HYDRALAZINE HCL 50 MG PO TABS
25.0000 mg | ORAL_TABLET | Freq: Two times a day (BID) | ORAL | Status: DC
  Administered 2023-12-20: 25 mg via ORAL
  Filled 2023-12-20: qty 1

## 2023-12-20 MED ORDER — ACETAMINOPHEN 160 MG/5ML PO SOLN
650.0000 mg | ORAL | Status: DC | PRN
Start: 2023-12-20 — End: 2023-12-20

## 2023-12-20 MED ORDER — POTASSIUM CHLORIDE CRYS ER 20 MEQ PO TBCR
40.0000 meq | EXTENDED_RELEASE_TABLET | Freq: Once | ORAL | Status: AC
  Administered 2023-12-20: 40 meq via ORAL
  Filled 2023-12-20: qty 2

## 2023-12-20 MED ORDER — MORPHINE SULFATE (PF) 4 MG/ML IV SOLN
4.0000 mg | Freq: Once | INTRAVENOUS | Status: AC
  Administered 2023-12-20: 4 mg via INTRAVENOUS
  Filled 2023-12-20: qty 1

## 2023-12-20 NOTE — ED Notes (Signed)
 Pt to MRI

## 2023-12-20 NOTE — Progress Notes (Signed)
 OT Screen Note  Patient Details Name: Anthony Harvey MRN: [id number] DOB: 26-Jan-1961   Cancelled Treatment:    Reason Eval/Treat Not Completed: OT screened, no needs identified, will sign off. Order received, chart reviewed. Pt at baseline functional independence. No skilled OT needs identified. Will sign off. Please re-consult if additional needs arise.   Arman Filter., MPH, MS, OTR/L ascom [phone] 12/20/23, 1:53 PM

## 2023-12-20 NOTE — Progress Notes (Signed)
 Echo at bedside

## 2023-12-20 NOTE — Progress Notes (Signed)
 PHARMACIST - PHYSICIAN COMMUNICATION  CONCERNING:  Enoxaparin (Lovenox) for DVT Prophylaxis    RECOMMENDATION: Patient was prescribed enoxaprin 40mg  q24 hours for VTE prophylaxis.   Filed Weights   12/19/23 2146  Weight: 111 kg (244 lb 11.4 oz)    Body mass index is 33.19 kg/m.  Estimated Creatinine Clearance: 78.9 mL/min (A) (by C-G formula based on SCr of 1.25 mg/dL (H)).   Based on [facility] policy patient is candidate for enoxaparin 0.5mg /kg TBW SQ every 24 hours based on BMI being >30.  DESCRIPTION: Pharmacy has adjusted enoxaparin dose per [facility] policy.  Patient is now receiving enoxaparin 0.5 mg/kg every 24 hours   Otelia Sergeant, PharmD, [facility] 12/20/2023 1:41 AM

## 2023-12-20 NOTE — Progress Notes (Signed)
 OT Cancellation Note  Patient Details Name: Anthony Harvey MRN: [id number] DOB: 09/04/1961   Cancelled Treatment:    Reason Eval/Treat Not Completed: Patient at procedure or test/ unavailable. Consult received, chart reviewed. Upon attempt, pt with staff completing echo. Will re-attempt OT evaluation at later time as pt is available and appropriate.   Arman Filter., MPH, MS, OTR/L ascom [phone] 12/20/23, 9:16 AM

## 2023-12-20 NOTE — H&P (Signed)
 History and Physical    Anthony Harvey ZOX:[id number] DOB: 09-Oct-1961 DOA: 12/19/2023  PCP: Sherrie Mustache, MD (Confirm with patient/family/NH records and if not entered, this has to be entered at [facility] point of entry) Patient coming from: Home  I have personally briefly reviewed patient's old medical records in [facility]  Chief Complaint: Chest pain, palpitations, left-sided numbness  HPI: Anthony Harvey is a 63 y.o. male with medical history significant of HTN, HLD, IDDM, sciatica left side, presented with multiple complaints including new onset of chest pain palpitations and left-sided numbness.  Last night, but woke him from ED, patient started to have pressure-like chest pain, on the left side of the chest, radiating to left-sided rib cage and left upper arm, associated with strong feeling of palpitations.  Soon after, patient started to develop left-sided arm and leg numbness and left foot aching.  EMS was called, EMS arrived and found patient " had a lot of PVCs" and patient was given 4 baby aspirin's and sent to ED.  Chest pain improved upon arrival in the ED.  Patient had a normal stress test 3 years ago.  ED Course: Afebrile, borderline tachycardia blood pressure 158/96, O2 saturation 96% on room air.  Stroke workup, brain MRI negative for acute findings.  CT head negative for acute findings.  CTA chest negative for PE.  Troponin negative x 2, EKG showed new Q waves on V1.  Review of Systems: As per HPI otherwise 14 point review of systems negative.    Past Medical History:  Diagnosis Date   Arthritis    Diabetes mellitus without complication (HCC)    GERD (gastroesophageal reflux disease)    Hx of back injury 2010   fell from piping and hit back   Hyperlipidemia    Hypertension    Iron deficiency anemia    Sciatica of left side    Vertigo    rare    Past Surgical History:  Procedure Laterality Date   COLONOSCOPY WITH PROPOFOL N/A 09/10/2023   Procedure:  COLONOSCOPY WITH PROPOFOL;  Surgeon: Midge Minium, MD;  Location: [facility];  Service: Endoscopy;  Laterality: N/A;   ESOPHAGOGASTRODUODENOSCOPY (EGD) WITH PROPOFOL N/A 09/10/2023   Procedure: ESOPHAGOGASTRODUODENOSCOPY (EGD) WITH PROPOFOL;  Surgeon: Midge Minium, MD;  Location: [facility];  Service: Endoscopy;  Laterality: N/A;  Diabetic   GIVENS CAPSULE STUDY N/A 09/26/2023   Procedure: GIVENS CAPSULE STUDY;  Surgeon: Midge Minium, MD;  Location: [facility] ENDOSCOPY;  Service: Endoscopy;  Laterality: N/A;   HERNIA REPAIR  08/03/2008   Right direct inguinal hernia, large Ultra Pro mesh.   KNEE ARTHROSCOPY Bilateral    KNEE ARTHROSCOPY Right 11/16/2016   Procedure: ARTHROSCOPY KNEE, lateral release, partial meniscectomy;  Surgeon: Kennedy Bucker, MD;  Location: [facility] ORS;  Service: Orthopedics;  Laterality: Right;   POLYPECTOMY  09/10/2023   Procedure: POLYPECTOMY;  Surgeon: Midge Minium, MD;  Location: [facility];  Service: Endoscopy;;   TONSILLECTOMY  as child     reports that he has never smoked. He has never used smokeless tobacco. He reports that he does not drink alcohol and does not use drugs.  Allergies  Allergen Reactions   Cymbalta [Duloxetine Hcl] Other (See Comments)    Chest pain   Duloxetine Other (See Comments)   Pregabalin     Other reaction(s): Vertigo, speech problems   Celebrex [Celecoxib] Palpitations   Niacin Hives and Rash    Family History  Problem Relation Age of Onset  Cancer Mother    Diabetes Mother      Prior to Admission medications   Medication Sig Start Date End Date Taking? Authorizing Provider  aspirin EC 81 MG tablet Take 81 mg by mouth at bedtime.   Yes [provider]  cyanocobalamin (VITAMIN B12) 1000 MCG tablet Take 1,000 mcg by mouth daily.   Yes [provider]  cyclobenzaprine (FLEXERIL) 5 MG tablet Take 5 mg by mouth once.   Yes [provider]  docusate sodium (COLACE) 100 MG capsule Take  100 mg by mouth at bedtime. Twice a day   Yes [provider]  ezetimibe (ZETIA) 10 MG tablet Take 10 mg by mouth daily.   Yes [provider]  glipiZIDE (GLUCOTROL XL) 5 MG 24 hr tablet Take 5 mg by mouth daily. 08/01/23  Yes [provider]  hydrALAZINE (APRESOLINE) 25 MG tablet Take 25 mg by mouth 2 (two) times daily. 10/08/23  Yes [provider]  lisinopril (ZESTRIL) 20 MG tablet Take 20 mg by mouth in the morning and at bedtime. 08/01/23  Yes [provider]  metFORMIN (GLUCOPHAGE) 1000 MG tablet Take 1,000 mg by mouth 2 (two) times daily with a meal.   Yes [provider]  metoprolol succinate (TOPROL-XL) 50 MG 24 hr tablet Take 50 mg by mouth daily. 06/14/15  Yes [provider]  Multiple Vitamins-Minerals (MULTIVITAMIN ADULTS 50+ PO) Take by mouth daily.   Yes [provider]  OVER THE COUNTER MEDICATION Sciatic Ease, b12   Yes [provider]  OZEMPIC, 0.25 OR 0.5 MG/DOSE, 2 MG/3ML SOPN 0.25 Units. 12/14/23  Yes [provider]  traMADol (ULTRAM) 50 MG tablet Take 50 mg by mouth every 6 (six) hours as needed (pain).  05/31/15  Yes [provider]  traMADol (ULTRAM-ER) 300 MG 24 hr tablet Take 300 mg by mouth daily. Pt takes 300mg  in the morning 06/22/15  Yes [provider]  Turmeric 500 MG TABS Take by mouth daily.   Yes [provider]  benzonatate (TESSALON) 100 MG capsule Take 2 capsules (200 mg total) by mouth every 8 (eight) hours. Patient not taking: Reported on 12/20/2023 10/22/23   Becky Augusta, NP  ergocalciferol (VITAMIN D2) 1.25 MG (50000 UT) capsule Take 50,000 Units by mouth once a week. Saturday Patient not taking: Reported on 12/20/2023    [provider]  FARXIGA 10 MG TABS tablet Take 10 mg by mouth daily. Patient not taking: Reported on 12/20/2023 06/29/23   [provider]  fenofibrate (TRICOR) 145 MG tablet Take 145 mg by mouth daily. Patient not  taking: Reported on 12/20/2023 06/14/15   [provider]  insulin glargine, 1 Unit Dial, (TOUJEO SOLOSTAR) 300 UNIT/ML Solostar Pen Inject 30 Units into the skin daily. Patient not taking: Reported on 12/20/2023    [provider]  ipratropium (ATROVENT) 0.06 % nasal spray Place 2 sprays into both nostrils 4 (four) times daily. Patient not taking: Reported on 12/20/2023 10/22/23   Becky Augusta, NP  promethazine-dextromethorphan (PROMETHAZINE-DM) 6.25-15 MG/5ML syrup Take 5 mLs by mouth 4 (four) times daily as needed. Patient not taking: Reported on 12/20/2023 10/22/23   Becky Augusta, NP    Physical Exam: Vitals:   12/20/23 0515 12/20/23 0530 12/20/23 0536 12/20/23 0900  BP: 128/62 113/60  112/65  Pulse: (!) 57 (!) 58  60  Resp: 17 16  18   Temp:   98 F (36.7 C) 98.7 F (37.1 C)  TempSrc:   Oral  Oral  SpO2: 98% 97%  98%  Weight:      Height:        Constitutional: NAD, calm, comfortable Vitals:   12/20/23 0515 12/20/23 0530 12/20/23 0536 12/20/23 0900  BP: 128/62 113/60  112/65  Pulse: (!) 57 (!) 58  60  Resp: 17 16  18   Temp:   98 F (36.7 C) 98.7 F (37.1 C)  TempSrc:   Oral Oral  SpO2: 98% 97%  98%  Weight:      Height:       Eyes: PERRL, lids and conjunctivae normal ENMT: Mucous membranes are moist. Posterior pharynx clear of any exudate or lesions.Normal dentition.  Neck: normal, supple, no masses, no thyromegaly Respiratory: clear to auscultation bilaterally, no wheezing, no crackles. Normal respiratory effort. No accessory muscle use.  Cardiovascular: Regular rate and rhythm, no murmurs / rubs / gallops. No extremity edema. 2+ pedal pulses. No carotid bruits.  Abdomen: no tenderness, no masses palpated. No hepatosplenomegaly. Bowel sounds positive.  Musculoskeletal: no clubbing / cyanosis. No joint deformity upper and lower extremities. Good ROM, no contractures. Normal muscle tone.  Tenderness on left aspect of chest Skin: no rashes, lesions, ulcers. No  induration Neurologic: CN 2-12 grossly intact. Sensation intact, DTR normal. Strength 5/5 in all 4.  Psychiatric: Normal judgment and insight. Alert and oriented x 3. Normal mood.    Labs on Admission: I have personally reviewed following labs and imaging studies  CBC: Recent Labs  Lab 12/19/23 2223 12/20/23 0202  WBC 12.2* 12.7*  NEUTROABS 3.2  --   HGB 14.0 13.4  HCT 41.1 39.4  MCV 83.9 84.0  PLT 178 195   Basic Metabolic Panel: Recent Labs  Lab 12/19/23 2223 12/20/23 0202  NA 137  --   K 3.4*  --   CL 103  --   CO2 22  --   GLUCOSE 264*  --   BUN 18  --   CREATININE 1.25* 1.11  CALCIUM 9.3  --    GFR: Estimated Creatinine Clearance: 88.8 mL/min (by C-G formula based on SCr of 1.11 mg/dL). Liver Function Tests: Recent Labs  Lab 12/19/23 2223  AST 26  ALT 29  ALKPHOS 39  BILITOT 0.5  PROT 6.6  ALBUMIN 4.4   Recent Labs  Lab 12/19/23 2223  LIPASE 28   No results for input(s): "AMMONIA" in the last 168 hours. Coagulation Profile: No results for input(s): "INR", "PROTIME" in the last 168 hours. Cardiac Enzymes: No results for input(s): "CKTOTAL", "CKMB", "CKMBINDEX", "TROPONINI" in the last 168 hours. BNP (last 3 results) No results for input(s): "PROBNP" in the last 8760 hours. HbA1C: Recent Labs    12/20/23 0202  HGBA1C 7.5*   CBG: No results for input(s): "GLUCAP" in the last 168 hours. Lipid Profile: Recent Labs    12/20/23 0202  CHOL 176  HDL 30*  LDLCALC 74  TRIG 161*  CHOLHDL 5.9   Thyroid Function Tests: No results for input(s): "TSH", "T4TOTAL", "FREET4", "T3FREE", "THYROIDAB" in the last 72 hours. Anemia Panel: No results for input(s): "VITAMINB12", "FOLATE", "FERRITIN", "TIBC", "IRON", "RETICCTPCT" in the last 72 hours. Urine analysis: No results found for: "COLORURINE", "APPEARANCEUR", "LABSPEC", "PHURINE", "GLUCOSEU", "HGBUR", "BILIRUBINUR", "KETONESUR", "PROTEINUR", "UROBILINOGEN", "NITRITE", "LEUKOCYTESUR"  Radiological  Exams on Admission: MR BRAIN WO CONTRAST Result Date: 12/20/2023 CLINICAL DATA:  Neuro deficit, acute, stroke suspected EXAM: MRI HEAD WITHOUT CONTRAST TECHNIQUE: Multiplanar, multiecho pulse sequences of the brain and surrounding structures were obtained without intravenous contrast. COMPARISON:  CT  head March 5, 25. FINDINGS: Brain: No acute infarction, hemorrhage, hydrocephalus, extra-axial collection or mass lesion. Vascular: Major arterial flow voids are maintained at the skull base. Skull and upper cervical spine: Normal marrow signal. Sinuses/Orbits: Mild paranasal sinus mucosal thickening. Left maxillary sinus retention cyst. No acute orbital findings. Other: No mastoid effusions. IMPRESSION: No acute abnormality. Electronically Signed   By: Feliberto Harts M.D.   On: 12/20/2023 02:31   CT Angio Chest Aorta W and/or Wo Contrast Result Date: 12/20/2023 CLINICAL DATA:  Left-sided chest pain EXAM: CT ANGIOGRAPHY CHEST WITH CONTRAST TECHNIQUE: Multidetector CT imaging of the chest was performed using the standard protocol during bolus administration of intravenous contrast. Multiplanar CT image reconstructions and MIPs were obtained to evaluate the vascular anatomy. RADIATION DOSE REDUCTION: This exam was performed according to the departmental dose-optimization program which includes automated exposure control, adjustment of the mA and/or kV according to patient size and/or use of iterative reconstruction technique. CONTRAST:  OMNIPAQUE IOHEXOL 350 MG/ML SOLN COMPARISON:  None Available. FINDINGS: Cardiovascular: Initial precontrast images show no hyperdense crescent to suggest acute aortic injury. Post-contrast images show no evidence of aneurysmal dilatation or dissection. Heart is not significantly enlarged in size. Pulmonary artery shows a normal branching pattern bilaterally. No intraluminal filling defect to suggest pulmonary embolism is seen. Mediastinum/Nodes: Thoracic inlet is within normal  limits. No hilar or mediastinal adenopathy is noted. The esophagus as visualized is within normal limits. Lungs/Pleura: Lungs are well aerated bilaterally. No focal infiltrate or sizable effusion is seen. A few small subpleural nodules are noted measuring less than 4 mm. No sizable parenchymal nodule is noted. Upper Abdomen: Visualized upper abdomen shows no acute abnormality Musculoskeletal: Bony structures are within normal limits. Review of the MIP images confirms the above findings. IMPRESSION: No evidence of pulmonary emboli. No acute aortic abnormality is noted. Multiple pulmonary nodules. Most significant: Solid pulmonary nodule measuring 3 mm. Per Fleischner Society Guidelines, no routine follow-up imaging is recommended. These guidelines do not apply to immunocompromised patients and patients with cancer. Follow up in patients with significant comorbidities as clinically warranted. For lung cancer screening, adhere to Lung-RADS guidelines. Reference: Radiology. 2017; 284(1):228-43. Aortic Atherosclerosis (ICD10-I70.0). Electronically Signed   By: Alcide Clever M.D.   On: 12/20/2023 00:45   CT HEAD WO CONTRAST ( ) Result Date: 12/20/2023 CLINICAL DATA:  Mental status change, unknown cause EXAM: CT HEAD WITHOUT CONTRAST TECHNIQUE: Contiguous axial images were obtained from the base of the skull through the vertex without intravenous contrast. RADIATION DOSE REDUCTION: This exam was performed according to the departmental dose-optimization program which includes automated exposure control, adjustment of the mA and/or kV according to patient size and/or use of iterative reconstruction technique. COMPARISON:  None Available. FINDINGS: Brain: No evidence of acute infarction, hemorrhage, hydrocephalus, extra-axial collection or mass lesion/mass effect. Vascular: No hyperdense vessel. Skull: No acute fracture. Sinuses/Orbits: Mostly clear sinuses.  No acute orbital findings. Other: No mastoid effusions.  IMPRESSION: No evidence of acute intracranial abnormality. Electronically Signed   By: Feliberto Harts M.D.   On: 12/20/2023 00:26   DG Chest Portable 1 View Result Date: 12/20/2023 CLINICAL DATA:  Left-sided chest pain and left arm numbness. EXAM: PORTABLE CHEST 1 VIEW COMPARISON:  July 12, 2012 FINDINGS: The heart size and mediastinal contours are within normal limits. Both lungs are clear. The visualized skeletal structures are unremarkable. IMPRESSION: No active disease. Electronically Signed   By: Aram Candela M.D.   On: 12/20/2023 00:09    EKG: Independently reviewed.  Sinus rhythm, new Q wave in V1, no acute ST changes.  Assessment/Plan Principal Problem:   Chest pain  (please populate well all problems here in Problem List. (For example, if patient is on BP meds at home and you resume or decide to hold them, it is a problem that needs to be her. Same for CAD, COPD, HLD and so on)  Atypical chest pain -Troponin negative x 2, EKG showed no acute ST changes, ACS ruled out -Echocardiogram, if echocardiogram shows no significant acute wall motion abnormalities, likely patient can be discharged home and follow-up with cardiology for outpatient stress test. -Continue aspirin and statin  TIA -MRI negative for stroke. -Will check MRA head and neck -Continue aspirin and statin -Echocardiogram  Frequent PVCs -Appears to be transient, documented by EMS.  On ED telemonitoring, there is no significant ventricular arrhythmia or A-fib.  Recommend patient follow-up with cardiology for Zio patch. -Check TSH  Hypokalemia -P.o. replacement  IIDM -A1c= 7.5, fairly controlled -SSI for now -Hold off metformin for 58 hours  HTN -Stable, continue home BP meds  DVT prophylaxis: Lovenox Code Status: Full code Family Communication: Wife at bedside Disposition Plan: Expect less than 2 midnight hospital stay Consults called: None Admission status: Telemetry observation   Emeline General MD [facility] Pager [phone]  12/20/2023, 9:54 AM

## 2023-12-20 NOTE — Evaluation (Signed)
 Physical Therapy Evaluation Patient Details Name: Anthony Harvey MRN: [id number] DOB: 31-Oct-1960 Today's Date: 12/20/2023  History of Present Illness  Pt is a 63 y.o. male presenting to hospital 12/19/23 with c/o sudden onset chest pain radiating to L arm and N/T entire L side of body.  Pt admitted with atypical chest pain, TIA, frequent PVC's, hypokalemia.  PMH includes DM, htn, HLD, L side sciatica.  Clinical Impression  Prior to recent medical concerns, pt reports being independent with functional mobility; no falls in >1 year; lives with his wife in 1 level home.  6-7/10 headache and L foot pain reported during session (pt reported no change in pain during sessions activities)--nurse notified.  Currently pt is independent with bed mobility; independent with transfers; and independent with ambulation in hallway.  No loss of balance noted during sessions activities.  No acute PT needs identified; will sign off; discussed with pt who was in agreement; nurse and MD updated on pt's status.    If plan is discharge home, recommend the following: Assist for transportation   Can travel by private vehicle    Yes    Equipment Recommendations None recommended by PT  Recommendations for Other Services       Functional Status Assessment Patient has not had a recent decline in their functional status     Precautions / Restrictions Precautions Precautions: None Recall of Precautions/Restrictions: Intact Restrictions Weight Bearing Restrictions Per Provider Order: No      Mobility  Bed Mobility Overal bed mobility: Independent             General bed mobility comments: No difficulties noted    Transfers Overall transfer level: Independent Equipment used: None               General transfer comment: steady transfer from bed    Ambulation/Gait Ambulation/Gait assistance: Independent Gait Distance (Feet):  (approximately 120 feet) Assistive device: None Gait  Pattern/deviations: Step-through pattern       General Gait Details: mild decreased stance time L LE d/t L foot pain; steady ambulation  Stairs            Wheelchair Mobility     Tilt Bed    Modified Rankin (Stroke Patients Only)       Balance Overall balance assessment: Needs assistance Sitting-balance support: No upper extremity supported, Feet supported Sitting balance-Leahy Scale: Normal Sitting balance - Comments: steady reaching outside BOS   Standing balance support: No upper extremity supported, During functional activity Standing balance-Leahy Scale: Normal Standing balance comment: steady ambulation in hallway; no loss of balance with ambulation and head turns R/L/up/down or turning 180 degrees and stopping                             Pertinent Vitals/Pain Pain Assessment Pain Assessment: 0-10 Pain Score: 7  Pain Location: L foot (metatarsals plantar/dorsal across foot) and headache Pain Descriptors / Indicators: Headache (stabbing tender pain L foot plantar/dorsal surface of metatarsals) Pain Intervention(s): Limited activity within patient's tolerance, Monitored during session Vitals (HR and SpO2 on room air) stable during treatment session.     Home Living Family/patient expects to be discharged to:: Private residence Living Arrangements: Spouse/significant other Available Help at Discharge: Family Type of Home: House Home Access: Stairs to enter;Ramped entrance   Entrance Stairs-Number of Steps: ramp from front; 5 STE B railings from back   Home Layout: One level Home Equipment: Shower seat;Cane - single point;Rolling  Walker (2 wheels);BSC/3in1      Prior Function Prior Level of Function : Independent/Modified Independent             Mobility Comments: Independent with ambulation; h/o falls but no falls in >1 year; goes to pain clinic       Extremity/Trunk Assessment   Upper Extremity Assessment Upper Extremity  Assessment: Overall WFL for tasks assessed    Lower Extremity Assessment Lower Extremity Assessment:  (5/5 B hip flexion, knee flexion/extension, and DF R foot (deferred MMT L foot d/t pain but appearing WFL); intact B LE light touch and heel to shin coordination)    Cervical / Trunk Assessment Cervical / Trunk Assessment: Normal  Communication   Communication Communication: No apparent difficulties    Cognition Arousal: Alert Behavior During Therapy: WFL for tasks assessed/performed   PT - Cognitive impairments: No apparent impairments                         Following commands: Intact       Cueing Cueing Techniques: Verbal cues     General Comments      Exercises     Assessment/Plan    PT Assessment Patient does not need any further PT services  PT Problem List         PT Treatment Interventions      PT Goals (Current goals can be found in the Care Plan section)  Acute Rehab PT Goals Patient Stated Goal: to improve pain PT Goal Formulation: With patient Time For Goal Achievement: 01/03/24 Potential to Achieve Goals: Fair    Frequency       Co-evaluation               AM-PAC PT "6 Clicks" Mobility  Outcome Measure Help needed turning from your back to your side while in a flat bed without using bedrails?: None Help needed moving from lying on your back to sitting on the side of a flat bed without using bedrails?: None Help needed moving to and from a bed to a chair (including a wheelchair)?: None Help needed standing up from a chair using your arms (e.g., wheelchair or bedside chair)?: None Help needed to walk in hospital room?: None Help needed climbing 3-5 steps with a railing? : None 6 Click Score: 24    End of Session   Activity Tolerance: Patient tolerated treatment well;No increased pain Patient left: in bed;with call bell/phone within reach;with bed alarm set;with family/visitor present Nurse Communication: Mobility  status;Precautions;Other (comment) (Pt's pain status) PT Visit Diagnosis: Other symptoms and signs involving the nervous system (U04.540)    Time: 9811-9147 PT Time Calculation (min) (ACUTE ONLY): 18 min   Charges:   PT Evaluation $PT Eval Low Complexity: 1 Low   PT General Charges $$ ACUTE PT VISIT: 1 Visit        Hendricks Limes, PT 12/20/23, 1:51 PM

## 2023-12-20 NOTE — ED Notes (Signed)
 Ambulated Pt to BR, no incident. Back to bed with CB in reach

## 2023-12-20 NOTE — Progress Notes (Signed)
*  PRELIMINARY RESULTS* Echocardiogram 2D Echocardiogram has been performed.  Anthony Harvey 12/20/2023, 9:39 AM

## 2023-12-20 NOTE — ED Notes (Signed)
 Fall risk interventions implemented

## 2023-12-20 NOTE — Discharge Summary (Signed)
 Physician Discharge Summary   Patient: Anthony Harvey MRN: [id number] DOB: 12-30-1960  Admit date:     12/19/2023  Discharge date: 12/20/23  Discharge Physician: Emeline General   PCP: Sherrie Mustache, MD   Recommendations at discharge:   Follow-up with cardiology in 1 to 2 weeks for Zio patch to rule out PAF and possible stress test  Discharge Diagnoses: Principal Problem:   Chest pain TIA  Hospital Course:  63 year old male patient with past [place] of HTN, HLD, IDDM, presented with multiple complaints including left-sided numbness as well as chest pain and palpitations.  Stroke workup including brain MRI, brain MRA and neck MRA negative for acute findings.  Echocardiogram showed no thrombosis.  Telemonitoring showed no A-fib or other arrhythmia.  Chest pain workup benign, EKG showed no ischemic changes.  Troponin negative x 2 ACS ruled out.  Recommend outpatient follow-up with cardiology for stress test.  Discharged home on stable conditions      Pain control - [facility] Controlled Substance Reporting System database was reviewed. and patient was instructed, not to drive, operate heavy machinery, perform activities at heights, swimming or participation in water activities or provide baby-sitting services while on Pain, Sleep and Anxiety Medications; until their outpatient Physician has advised to do so again. Also recommended to not to take more than prescribed Pain, Sleep and Anxiety Medications.  Consultants: None Procedures performed: MRI, MRA, CTA chest, echocardiogram Disposition: Home Diet recommendation:  Cardiac and Carb modified diet DISCHARGE MEDICATION: Allergies as of 12/20/2023       Reactions   Cymbalta [duloxetine Hcl] Other (See Comments)   Chest pain   Duloxetine Other (See Comments)   Pregabalin    Other reaction(s): Vertigo, speech problems   Celebrex [celecoxib] Palpitations   Niacin Hives, Rash        Medication List     TAKE these  medications    aspirin EC 81 MG tablet Take 81 mg by mouth at bedtime.   benzonatate 100 MG capsule Commonly known as: TESSALON Take 2 capsules (200 mg total) by mouth every 8 (eight) hours.   cyanocobalamin 1000 MCG tablet Commonly known as: VITAMIN B12 Take 1,000 mcg by mouth daily.   cyclobenzaprine 5 MG tablet Commonly known as: FLEXERIL Take 5 mg by mouth once.   docusate sodium 100 MG capsule Commonly known as: COLACE Take 100 mg by mouth at bedtime. Twice a day   ergocalciferol 1.25 MG (50000 UT) capsule Commonly known as: VITAMIN D2 Take 50,000 Units by mouth once a week. Saturday   ezetimibe 10 MG tablet Commonly known as: ZETIA Take 10 mg by mouth daily.   Farxiga 10 MG Tabs tablet Generic drug: dapagliflozin propanediol Take 10 mg by mouth daily.   fenofibrate 145 MG tablet Commonly known as: TRICOR Take 145 mg by mouth daily.   glipiZIDE 5 MG 24 hr tablet Commonly known as: GLUCOTROL XL Take 5 mg by mouth daily.   hydrALAZINE 25 MG tablet Commonly known as: APRESOLINE Take 25 mg by mouth 2 (two) times daily.   ipratropium 0.06 % nasal spray Commonly known as: ATROVENT Place 2 sprays into both nostrils 4 (four) times daily.   lisinopril 20 MG tablet Commonly known as: ZESTRIL Take 20 mg by mouth in the morning and at bedtime.   metFORMIN 1000 MG tablet Commonly known as: GLUCOPHAGE Take 1,000 mg by mouth 2 (two) times daily with a meal.   metoprolol succinate 50 MG 24 hr tablet Commonly known as: TOPROL-XL  Take 50 mg by mouth daily.   MULTIVITAMIN ADULTS 50+ PO Take by mouth daily.   OVER THE COUNTER MEDICATION Sciatic Ease, b12   Ozempic (0.25 or 0.5 MG/DOSE) 2 MG/3ML Sopn Generic drug: Semaglutide(0.25 or 0.5MG /DOS) 0.25 Units.   promethazine-dextromethorphan 6.25-15 MG/5ML syrup Commonly known as: PROMETHAZINE-DM Take 5 mLs by mouth 4 (four) times daily as needed.   Toujeo SoloStar 300 UNIT/ML Solostar Pen Generic drug: insulin  glargine (1 Unit Dial) Inject 30 Units into the skin daily.   traMADol 50 MG tablet Commonly known as: ULTRAM Take 50 mg by mouth every 6 (six) hours as needed (pain).   traMADol 300 MG 24 hr tablet Commonly known as: ULTRAM-ER Take 300 mg by mouth daily. Pt takes 300mg  in the morning   Turmeric 500 MG Tabs Take by mouth daily.        Discharge Exam: Filed Weights   12/19/23 2146  Weight: 111 kg   Eyes: PERRL, lids and conjunctivae normal ENMT: Mucous membranes are moist. Posterior pharynx clear of any exudate or lesions.Normal dentition.  Neck: normal, supple, no masses, no thyromegaly Respiratory: clear to auscultation bilaterally, no wheezing, no crackles. Normal respiratory effort. No accessory muscle use.  Cardiovascular: Regular rate and rhythm, no murmurs / rubs / gallops. No extremity edema. 2+ pedal pulses. No carotid bruits.  Abdomen: no tenderness, no masses palpated. No hepatosplenomegaly. Bowel sounds positive.  Musculoskeletal: no clubbing / cyanosis. No joint deformity upper and lower extremities. Good ROM, no contractures. Normal muscle tone.  Skin: no rashes, lesions, ulcers. No induration Neurologic: CN 2-12 grossly intact. Sensation intact, DTR normal.  Muscle strength 5/5 on both sides Psychiatric: Normal judgment and insight. Alert and oriented x 3. Normal mood.     Condition at discharge: good  The results of significant diagnostics from this hospitalization (including imaging, microbiology, ancillary and laboratory) are listed below for reference.   Imaging Studies: ECHOCARDIOGRAM COMPLETE Result Date: 12/20/2023    ECHOCARDIOGRAM REPORT   Patient Name:   Anthony Harvey Date of Exam: 12/20/2023 Medical Rec #:  [id number]        Height:       72.0 in Accession #:    [id number]       Weight:       244.7 lb Date of Birth:  27-Sep-1961        BSA:          2.321 m Patient Age:    62 years         BP:           113/60 mmHg Patient Gender: M                HR:            67 bpm. Exam Location:  [facility] Procedure: 2D Echo, Cardiac Doppler, Color Doppler and Strain Analysis (Both            Spectral and Color Flow Doppler were utilized during procedure). Indications:     Chest Pain  History:         Patient has no prior history of Echocardiogram examinations.                  Signs/Symptoms:Chest Pain; Risk Factors:Hypertension.  Sonographer:     Mikki Harbor Referring Phys:  [id number] Emeline General Diagnosing Phys: Alwyn Pea MD  Sonographer Comments: Suboptimal subcostal window. Image acquisition challenging due to respiratory motion. Global longitudinal strain was attempted. IMPRESSIONS  1.  Left ventricular ejection fraction, by estimation, is 60 to 65%. The left ventricle has normal function. The left ventricle has no regional wall motion abnormalities. Left ventricular diastolic parameters are consistent with Grade I diastolic dysfunction (impaired relaxation). The average left ventricular global longitudinal strain is 15.3 %. The global longitudinal strain is abnormal.  2. Right ventricular systolic function is normal. The right ventricular size is normal.  3. The mitral valve is normal in structure. No evidence of mitral valve regurgitation.  4. The aortic valve is normal in structure. Aortic valve regurgitation is not visualized. FINDINGS  Left Ventricle: Left ventricular ejection fraction, by estimation, is 60 to 65%. The left ventricle has normal function. The left ventricle has no regional wall motion abnormalities. The average left ventricular global longitudinal strain is 15.3 %. Strain was performed and the global longitudinal strain is abnormal. The left ventricular internal cavity size was normal in size. There is no left ventricular hypertrophy. Left ventricular diastolic parameters are consistent with Grade I diastolic dysfunction (impaired relaxation). Right Ventricle: The right ventricular size is normal. No increase in right ventricular wall thickness.  Right ventricular systolic function is normal. Left Atrium: Left atrial size was normal in size. Right Atrium: Right atrial size was normal in size. Pericardium: There is no evidence of pericardial effusion. Mitral Valve: The mitral valve is normal in structure. No evidence of mitral valve regurgitation. MV peak gradient, 2.8 mmHg. The mean mitral valve gradient is 1.0 mmHg. Tricuspid Valve: The tricuspid valve is normal in structure. Tricuspid valve regurgitation is not demonstrated. Aortic Valve: The aortic valve is normal in structure. Aortic valve regurgitation is not visualized. Aortic valve mean gradient measures 2.0 mmHg. Aortic valve peak gradient measures 5.6 mmHg. Aortic valve area, by VTI measures 4.44 cm. Pulmonic Valve: The pulmonic valve was normal in structure. Pulmonic valve regurgitation is not visualized. Aorta: The ascending aorta was not well visualized. IAS/Shunts: No atrial level shunt detected by color flow Doppler. Additional Comments: 3D was performed not requiring image post processing on an independent workstation and was indeterminate.  LEFT VENTRICLE PLAX 2D LVIDd:         4.70 cm     Diastology LVIDs:         3.10 cm     LV e' medial:    7.94 cm/s LV PW:         1.00 cm     LV E/e' medial:  8.5 LV IVS:        1.10 cm     LV e' lateral:   10.10 cm/s LVOT diam:     2.30 cm     LV E/e' lateral: 6.7 LV SV:         92 LV SV Index:   40          2D Longitudinal Strain LVOT Area:     4.15 cm    2D Strain GLS (A4C):   15.1 %                            2D Strain GLS (A3C):   15.8 %                            2D Strain GLS (A2C):   14.9 % LV Volumes (MOD)           2D Strain GLS Avg:     15.3 % LV vol  d, MOD A2C: 92.5 ml LV vol d, MOD A4C: 89.8 ml LV vol s, MOD A2C: 47.3 ml LV vol s, MOD A4C: 30.9 ml LV SV MOD A2C:     45.2 ml LV SV MOD A4C:     89.8 ml LV SV MOD BP:      55.5 ml RIGHT VENTRICLE RV Basal diam:  4.30 cm RV Mid diam:    3.90 cm RV S prime:     13.50 cm/s TAPSE (M-mode): 2.8 cm  LEFT ATRIUM             Index        RIGHT ATRIUM           Index LA diam:        3.80 cm 1.64 cm/m   RA Area:     12.20 cm LA Vol (A2C):   81.1 ml 34.94 ml/m  RA Volume:   24.50 ml  10.55 ml/m LA Vol (A4C):   38.5 ml 16.59 ml/m LA Biplane Vol: 59.4 ml 25.59 ml/m  AORTIC VALVE                    PULMONIC VALVE AV Area (Vmax):    3.39 cm     PV Vmax:       1.10 m/s AV Area (Vmean):   3.75 cm     PV Peak grad:  4.8 mmHg AV Area (VTI):     4.44 cm AV Vmax:           118.00 cm/s AV Vmean:          65.400 cm/s AV VTI:            0.207 m AV Peak Grad:      5.6 mmHg AV Mean Grad:      2.0 mmHg LVOT Vmax:         96.30 cm/s LVOT Vmean:        59.100 cm/s LVOT VTI:          0.221 m LVOT/AV VTI ratio: 1.07  AORTA Ao Root diam: 3.70 cm MITRAL VALVE MV Area (PHT): 3.26 cm    SHUNTS MV Area VTI:   3.22 cm    Systemic VTI:  0.22 m MV Peak grad:  2.8 mmHg    Systemic Diam: 2.30 cm MV Mean grad:  1.0 mmHg MV Vmax:       0.84 m/s MV Vmean:      48.7 cm/s MV Decel Time: 233 msec MV E velocity: 67.30 cm/s MV A velocity: 68.10 cm/s MV E/A ratio:  0.99 Dwayne D Callwood MD Electronically signed by Alwyn Pea MD Signature Date/Time: 12/20/2023/4:19:52 PM    Final    MR ANGIO HEAD WO CONTRAST Result Date: 12/20/2023 CLINICAL DATA:  63 year old male with neurologic deficit.  TIA. EXAM: MRA HEAD WITHOUT CONTRAST MRA NECK WITHOUT CONTRAST TECHNIQUE: Angiographic images of the Circle of Willis were obtained using MRA technique without intravenous contrast. Angiographic images of the neck were obtained using MRA technique without intravenous contrast. Carotid stenosis measurements (when applicable) are obtained utilizing NASCET criteria, using the distal internal carotid diameter as the denominator. COMPARISON:  Brain MRI 0212 hours today. FINDINGS: MRA NECK FINDINGS Antegrade flow in both cervical carotid arteries, through both carotid bifurcations and to the skull base. Visible proximal ICA siphons also appear patent and  normal. Probable 3 vessel arch configuration. Antegrade flow in both cervical vertebral arteries, the right appears dominant and the left is diminutive.  But antegrade flow in both vessels continues to the skull base. No evidence of stenosis. And similar appearance of the proximal vertebral arteries on CTA chest yesterday. MRA HEAD FINDINGS Antegrade flow in the posterior circulation, with these images beginning at the vertebrobasilar junction. Vertebral artery V4 segments and vertebrobasilar junction are normal on the neck MRA images. Patent basilar artery. Right PICA and dominant appearing left AICA. Basilar tip, SCA and PCA origins are normal. Posterior communicating arteries are diminutive or absent. Bilateral PCA branches are within normal limits. Antegrade flow in both ICA siphons. No siphon stenosis. Patent carotid termini. Normal MCA and ACA origins. Normal anterior communicating artery and visible ACA branches. Bilateral MCA M1 segments and MCA bifurcations appear patent without stenosis. Bilateral MCA branches are within normal limits. No evidence of intracranial mass effect or ventriculomegaly on these images, cavum vergae a variant again noted. IMPRESSION: Negative noncontrast Head and Neck MRA. Electronically Signed   By: Odessa Fleming M.D.   On: 12/20/2023 12:50   MR ANGIO NECK WO CONTRAST Result Date: 12/20/2023 CLINICAL DATA:  63 year old male with neurologic deficit.  TIA. EXAM: MRA HEAD WITHOUT CONTRAST MRA NECK WITHOUT CONTRAST TECHNIQUE: Angiographic images of the Circle of Willis were obtained using MRA technique without intravenous contrast. Angiographic images of the neck were obtained using MRA technique without intravenous contrast. Carotid stenosis measurements (when applicable) are obtained utilizing NASCET criteria, using the distal internal carotid diameter as the denominator. COMPARISON:  Brain MRI 0212 hours today. FINDINGS: MRA NECK FINDINGS Antegrade flow in both cervical carotid  arteries, through both carotid bifurcations and to the skull base. Visible proximal ICA siphons also appear patent and normal. Probable 3 vessel arch configuration. Antegrade flow in both cervical vertebral arteries, the right appears dominant and the left is diminutive. But antegrade flow in both vessels continues to the skull base. No evidence of stenosis. And similar appearance of the proximal vertebral arteries on CTA chest yesterday. MRA HEAD FINDINGS Antegrade flow in the posterior circulation, with these images beginning at the vertebrobasilar junction. Vertebral artery V4 segments and vertebrobasilar junction are normal on the neck MRA images. Patent basilar artery. Right PICA and dominant appearing left AICA. Basilar tip, SCA and PCA origins are normal. Posterior communicating arteries are diminutive or absent. Bilateral PCA branches are within normal limits. Antegrade flow in both ICA siphons. No siphon stenosis. Patent carotid termini. Normal MCA and ACA origins. Normal anterior communicating artery and visible ACA branches. Bilateral MCA M1 segments and MCA bifurcations appear patent without stenosis. Bilateral MCA branches are within normal limits. No evidence of intracranial mass effect or ventriculomegaly on these images, cavum vergae a variant again noted. IMPRESSION: Negative noncontrast Head and Neck MRA. Electronically Signed   By: Odessa Fleming M.D.   On: 12/20/2023 12:50   DG Foot 2 Views Left Result Date: 12/20/2023 CLINICAL DATA:  Left foot pain. EXAM: LEFT FOOT - 2 VIEW COMPARISON:  Left ankle radiographs 12/14/2008 FINDINGS: Mild plantar and mild right posterior calcaneal heel spurs, both mildly increased from 12/14/2008 remote radiographs. Increased mild degenerative spurring at the dorsal aspect of the tarsometatarsal joints on lateral view, likely the first or second tarsometatarsal joint. There are two ossicles measuring up to 3 mm at the proximal aspect of the medial great toe  metatarsophalangeal sesamoid on lateral view. This is suggestive of a normal variant multipartite sesamoid. Mild chronic enthesopathic change at the peroneus brevis insertion on the base of the fifth metatarsal. No acute fracture or dislocation. IMPRESSION: 1.  Mild plantar and mild posterior calcaneal heel spurs, both mildly increased from 12/14/2008 remote radiographs. 2. Mild degenerative spurring at the dorsal aspect of the first or second tarsometatarsal joint on lateral view. Electronically Signed   By: Neita Garnet M.D.   On: 12/20/2023 11:06   MR BRAIN WO CONTRAST Result Date: 12/20/2023 CLINICAL DATA:  Neuro deficit, acute, stroke suspected EXAM: MRI HEAD WITHOUT CONTRAST TECHNIQUE: Multiplanar, multiecho pulse sequences of the brain and surrounding structures were obtained without intravenous contrast. COMPARISON:  CT head March 5, 25. FINDINGS: Brain: No acute infarction, hemorrhage, hydrocephalus, extra-axial collection or mass lesion. Vascular: Major arterial flow voids are maintained at the skull base. Skull and upper cervical spine: Normal marrow signal. Sinuses/Orbits: Mild paranasal sinus mucosal thickening. Left maxillary sinus retention cyst. No acute orbital findings. Other: No mastoid effusions. IMPRESSION: No acute abnormality. Electronically Signed   By: Feliberto Harts M.D.   On: 12/20/2023 02:31   CT Angio Chest Aorta W and/or Wo Contrast Result Date: 12/20/2023 CLINICAL DATA:  Left-sided chest pain EXAM: CT ANGIOGRAPHY CHEST WITH CONTRAST TECHNIQUE: Multidetector CT imaging of the chest was performed using the standard protocol during bolus administration of intravenous contrast. Multiplanar CT image reconstructions and MIPs were obtained to evaluate the vascular anatomy. RADIATION DOSE REDUCTION: This exam was performed according to the departmental dose-optimization program which includes automated exposure control, adjustment of the mA and/or kV according to patient size and/or use  of iterative reconstruction technique. CONTRAST:  OMNIPAQUE IOHEXOL 350 MG/ML SOLN COMPARISON:  None Available. FINDINGS: Cardiovascular: Initial precontrast images show no hyperdense crescent to suggest acute aortic injury. Post-contrast images show no evidence of aneurysmal dilatation or dissection. Heart is not significantly enlarged in size. Pulmonary artery shows a normal branching pattern bilaterally. No intraluminal filling defect to suggest pulmonary embolism is seen. Mediastinum/Nodes: Thoracic inlet is within normal limits. No hilar or mediastinal adenopathy is noted. The esophagus as visualized is within normal limits. Lungs/Pleura: Lungs are well aerated bilaterally. No focal infiltrate or sizable effusion is seen. A few small subpleural nodules are noted measuring less than 4 mm. No sizable parenchymal nodule is noted. Upper Abdomen: Visualized upper abdomen shows no acute abnormality Musculoskeletal: Bony structures are within normal limits. Review of the MIP images confirms the above findings. IMPRESSION: No evidence of pulmonary emboli. No acute aortic abnormality is noted. Multiple pulmonary nodules. Most significant: Solid pulmonary nodule measuring 3 mm. Per Fleischner Society Guidelines, no routine follow-up imaging is recommended. These guidelines do not apply to immunocompromised patients and patients with cancer. Follow up in patients with significant comorbidities as clinically warranted. For lung cancer screening, adhere to Lung-RADS guidelines. Reference: Radiology. 2017; 284(1):228-43. Aortic Atherosclerosis (ICD10-I70.0). Electronically Signed   By: Alcide Clever M.D.   On: 12/20/2023 00:45   CT HEAD WO CONTRAST ( ) Result Date: 12/20/2023 CLINICAL DATA:  Mental status change, unknown cause EXAM: CT HEAD WITHOUT CONTRAST TECHNIQUE: Contiguous axial images were obtained from the base of the skull through the vertex without intravenous contrast. RADIATION DOSE REDUCTION: This exam  was performed according to the departmental dose-optimization program which includes automated exposure control, adjustment of the mA and/or kV according to patient size and/or use of iterative reconstruction technique. COMPARISON:  None Available. FINDINGS: Brain: No evidence of acute infarction, hemorrhage, hydrocephalus, extra-axial collection or mass lesion/mass effect. Vascular: No hyperdense vessel. Skull: No acute fracture. Sinuses/Orbits: Mostly clear sinuses.  No acute orbital findings. Other: No mastoid effusions. IMPRESSION: No evidence of acute intracranial abnormality. Electronically  Signed   By: Feliberto Harts M.D.   On: 12/20/2023 00:26   DG Chest Portable 1 View Result Date: 12/20/2023 CLINICAL DATA:  Left-sided chest pain and left arm numbness. EXAM: PORTABLE CHEST 1 VIEW COMPARISON:  July 12, 2012 FINDINGS: The heart size and mediastinal contours are within normal limits. Both lungs are clear. The visualized skeletal structures are unremarkable. IMPRESSION: No active disease. Electronically Signed   By: Aram Candela M.D.   On: 12/20/2023 00:09    Microbiology: Results for orders placed or performed during the hospital encounter of 09/20/23  Resp Panel by RT-PCR (Flu A&B, Covid) Anterior Nasal Swab     Status: None   Collection Time: 09/20/23 10:52 AM   Specimen: Anterior Nasal Swab  Result Value Ref Range Status   SARS Coronavirus 2 by RT PCR NEGATIVE NEGATIVE Final    Comment: (NOTE) SARS-CoV-2 target nucleic acids are NOT DETECTED.  The SARS-CoV-2 RNA is generally detectable in upper respiratory specimens during the acute phase of infection. The lowest concentration of SARS-CoV-2 viral copies this assay can detect is 138 copies/mL. A negative result does not preclude SARS-Cov-2 infection and should not be used as the sole basis for treatment or other patient management decisions. A negative result may occur with  improper specimen collection/handling, submission  of specimen other than nasopharyngeal swab, presence of viral mutation(s) within the areas targeted by this assay, and inadequate number of viral copies(<138 copies/mL). A negative result must be combined with clinical observations, patient history, and epidemiological information. The expected result is Negative.  Fact Sheet for Patients:  BloggerCourse.com  Fact Sheet for Healthcare Providers:  SeriousBroker.it  This test is no t yet approved or cleared by the [place] FDA and  has been authorized for detection and/or diagnosis of SARS-CoV-2 by FDA under an Emergency Use Authorization (EUA). This EUA will remain  in effect (meaning this test can be used) for the duration of the COVID-19 declaration under Section 564(b)(1) of the Act, 21 U.S.C.section 360bbb-3(b)(1), unless the authorization is terminated  or revoked sooner.       Influenza A by PCR NEGATIVE NEGATIVE Final   Influenza B by PCR NEGATIVE NEGATIVE Final    Comment: (NOTE) The Xpert Xpress SARS-CoV-2/FLU/RSV plus assay is intended as an aid in the diagnosis of influenza from Nasopharyngeal swab specimens and should not be used as a sole basis for treatment. Nasal washings and aspirates are unacceptable for Xpert Xpress SARS-CoV-2/FLU/RSV testing.  Fact Sheet for Patients: BloggerCourse.com  Fact Sheet for Healthcare Providers: SeriousBroker.it  This test is not yet approved or cleared by the [place] FDA and has been authorized for detection and/or diagnosis of SARS-CoV-2 by FDA under an Emergency Use Authorization (EUA). This EUA will remain in effect (meaning this test can be used) for the duration of the COVID-19 declaration under Section 564(b)(1) of the Act, 21 U.S.C. section 360bbb-3(b)(1), unless the authorization is terminated or revoked.  Performed at [facility], [address], [place], [place] [address]     Labs: CBC: Recent Labs  Lab 12/19/23 2223 12/20/23 0202  WBC 12.2* 12.7*  NEUTROABS 3.2  --   HGB 14.0 13.4  HCT 41.1 39.4  MCV 83.9 84.0  PLT 178 195   Basic Metabolic Panel: Recent Labs  Lab 12/19/23 2223 12/20/23 0202  NA 137  --   K 3.4*  --   CL 103  --   CO2 22  --   GLUCOSE 264*  --  BUN 18  --   CREATININE 1.25* 1.11  CALCIUM 9.3  --    Liver Function Tests: Recent Labs  Lab 12/19/23 2223  AST 26  ALT 29  ALKPHOS 39  BILITOT 0.5  PROT 6.6  ALBUMIN 4.4   CBG: Recent Labs  Lab 12/20/23 1237 12/20/23 1630  GLUCAP 142* 125*    Discharge time spent: less than 30 minutes.  Signed: Emeline General, MD [facility] 12/20/2023

## 2023-12-20 NOTE — ED Provider Notes (Signed)
-----------------------------------------   1:11 AM on 12/20/2023 -----------------------------------------   CT head and chest negative other than small pulmonary nodules which radiology does not recommend follow-up.  Patient having pain and persistent numbness.  Administer IV morphine, Flexeril which is what he takes at night.  Will consult hospital services for evaluation and admission.   Irean Hong, MD 12/20/23 [phone]

## 2023-12-24 ENCOUNTER — Emergency Department

## 2023-12-24 ENCOUNTER — Emergency Department
Admission: EM | Admit: 2023-12-24 | Discharge: 2023-12-25 | Disposition: A | Discharge status: Home / Self Care | Attending: Emergency Medicine | Admitting: Emergency Medicine

## 2023-12-24 ENCOUNTER — Other Ambulatory Visit: Payer: Self-pay

## 2023-12-24 DIAGNOSIS — Z7984 Long term (current) use of oral hypoglycemic drugs: Secondary | ICD-10-CM | POA: Insufficient documentation

## 2023-12-24 DIAGNOSIS — R0789 Other chest pain: Secondary | ICD-10-CM | POA: Insufficient documentation

## 2023-12-24 DIAGNOSIS — R079 Chest pain, unspecified: Secondary | ICD-10-CM

## 2023-12-24 DIAGNOSIS — Z794 Long term (current) use of insulin: Secondary | ICD-10-CM | POA: Diagnosis not present

## 2023-12-24 DIAGNOSIS — E119 Type 2 diabetes mellitus without complications: Secondary | ICD-10-CM | POA: Insufficient documentation

## 2023-12-24 DIAGNOSIS — R0602 Shortness of breath: Secondary | ICD-10-CM | POA: Diagnosis not present

## 2023-12-24 DIAGNOSIS — Z7982 Long term (current) use of aspirin: Secondary | ICD-10-CM | POA: Insufficient documentation

## 2023-12-24 DIAGNOSIS — I10 Essential (primary) hypertension: Secondary | ICD-10-CM | POA: Insufficient documentation

## 2023-12-24 DIAGNOSIS — Z79899 Other long term (current) drug therapy: Secondary | ICD-10-CM | POA: Insufficient documentation

## 2023-12-24 LAB — CBC
HCT: 45.5 % (ref 39.0–52.0)
Hemoglobin: 15.4 g/dL (ref 13.0–17.0)
MCH: 28.4 pg (ref 26.0–34.0)
MCHC: 33.8 g/dL (ref 30.0–36.0)
MCV: 83.8 fL (ref 80.0–100.0)
Platelets: 214 10*3/uL (ref 150–400)
RBC: 5.43 MIL/uL (ref 4.22–5.81)
RDW: 14.5 % (ref 11.5–15.5)
WBC: 13.4 10*3/uL — ABNORMAL HIGH (ref 4.0–10.5)
nRBC: 0 % (ref 0.0–0.2)

## 2023-12-24 LAB — BASIC METABOLIC PANEL
Anion gap: 10 (ref 5–15)
BUN: 22 mg/dL (ref 8–23)
CO2: 26 mmol/L (ref 22–32)
Calcium: 9.7 mg/dL (ref 8.9–10.3)
Chloride: 101 mmol/L (ref 98–111)
Creatinine, Ser: 1.51 mg/dL — ABNORMAL HIGH (ref 0.61–1.24)
GFR, Estimated: 52 mL/min — ABNORMAL LOW (ref >60)
Glucose, Bld: 97 mg/dL (ref 70–99)
Potassium: 4.4 mmol/L (ref 3.5–5.1)
Sodium: 137 mmol/L (ref 135–145)

## 2023-12-24 LAB — TROPONIN I (HIGH SENSITIVITY)
Troponin I (High Sensitivity): 10 ng/L (ref <18)
Troponin I (High Sensitivity): 10 ng/L (ref <18)

## 2023-12-24 MED ORDER — SODIUM CHLORIDE 0.9 % IV BOLUS (SEPSIS)
1000.0000 mL | Freq: Once | INTRAVENOUS | Status: AC
  Administered 2023-12-25: 1000 mL via INTRAVENOUS

## 2023-12-24 NOTE — ED Notes (Signed)
 Pt visitor to desk stating pts chest pain getting worse. Pt being taken back for repeat EKG and Vital Signs.

## 2023-12-24 NOTE — ED Provider Notes (Signed)
 [facility] Provider Note    Event Date/Time   First MD Initiated Contact with Patient 12/24/23 2346     (approximate)   History   Chest Pain   HPI  Anthony Harvey is a 63 y.o. male with history of hypertension, hyperlipidemia, diabetes who presents to the emergency department intermittent left-sided chest pain that feels like a pressure with associated shortness of breath, diaphoresis.  Pain intermittently radiates into his back.  States pain never last longer than 20 minutes and comes on at rest and with exertion.  He does not notice any aggravating relieving factors.  He was just admitted to the hospital for the same and had negative cardiac enzymes, negative dissection protocol CTA of the chest.  He states he was told to follow-up with his PCP and has an appointment to see them in 2 days.  He states he was told to get a referral to cardiology from his primary care doctor.  He states it has been at least 2 years since his last stress test.  No history of PE, DVT, exogenous estrogen use, recent fractures, surgery, trauma, hospitalization, prolonged travel or other immobilization. No lower extremity swelling or pain. No calf tenderness.  Denies any recent fevers or cough.   History provided by patient, wife.    Past Medical History:  Diagnosis Date   Arthritis    Diabetes mellitus without complication (HCC)    GERD (gastroesophageal reflux disease)    Hx of back injury 2010   fell from piping and hit back   Hyperlipidemia    Hypertension    Iron deficiency anemia    Sciatica of left side    Vertigo    rare    Past Surgical History:  Procedure Laterality Date   COLONOSCOPY WITH PROPOFOL N/A 09/10/2023   Procedure: COLONOSCOPY WITH PROPOFOL;  Surgeon: Midge Minium, MD;  Location: [facility];  Service: Endoscopy;  Laterality: N/A;   ESOPHAGOGASTRODUODENOSCOPY (EGD) WITH PROPOFOL N/A 09/10/2023   Procedure: ESOPHAGOGASTRODUODENOSCOPY (EGD)  WITH PROPOFOL;  Surgeon: Midge Minium, MD;  Location: [facility];  Service: Endoscopy;  Laterality: N/A;  Diabetic   GIVENS CAPSULE STUDY N/A 09/26/2023   Procedure: GIVENS CAPSULE STUDY;  Surgeon: Midge Minium, MD;  Location: [facility];  Service: Endoscopy;  Laterality: N/A;   HERNIA REPAIR  08/03/2008   Right direct inguinal hernia, large Ultra Pro mesh.   KNEE ARTHROSCOPY Bilateral    KNEE ARTHROSCOPY Right 11/16/2016   Procedure: ARTHROSCOPY KNEE, lateral release, partial meniscectomy;  Surgeon: Kennedy Bucker, MD;  Location: [facility];  Service: Orthopedics;  Laterality: Right;   POLYPECTOMY  09/10/2023   Procedure: POLYPECTOMY;  Surgeon: Midge Minium, MD;  Location: [facility];  Service: Endoscopy;;   TONSILLECTOMY  as child    MEDICATIONS:  Prior to Admission medications   Medication Sig Start Date End Date Taking? Authorizing Provider  aspirin EC 81 MG tablet Take 81 mg by mouth at bedtime.    [provider]  benzonatate (TESSALON) 100 MG capsule Take 2 capsules (200 mg total) by mouth every 8 (eight) hours. Patient not taking: Reported on 12/20/2023 10/22/23   Becky Augusta, NP  cyanocobalamin (VITAMIN B12) 1000 MCG tablet Take 1,000 mcg by mouth daily.    [provider]  cyclobenzaprine (FLEXERIL) 5 MG tablet Take 5 mg by mouth once.    [provider]  docusate sodium (COLACE) 100 MG capsule Take 100 mg by mouth at bedtime. Twice a day  [provider]  ergocalciferol (VITAMIN D2) 1.25 MG (50000 UT) capsule Take 50,000 Units by mouth once a week. Saturday Patient not taking: Reported on 12/20/2023    [provider]  ezetimibe (ZETIA) 10 MG tablet Take 10 mg by mouth daily.    [provider]  FARXIGA 10 MG TABS tablet Take 10 mg by mouth daily. Patient not taking: Reported on 12/20/2023 06/29/23   [provider]  fenofibrate (TRICOR) 145 MG tablet Take 145 mg by mouth daily. Patient not taking:  Reported on 12/20/2023 06/14/15   [provider]  glipiZIDE (GLUCOTROL XL) 5 MG 24 hr tablet Take 5 mg by mouth daily. 08/01/23   [provider]  hydrALAZINE (APRESOLINE) 25 MG tablet Take 25 mg by mouth 2 (two) times daily. 10/08/23   [provider]  insulin glargine, 1 Unit Dial, (TOUJEO SOLOSTAR) 300 UNIT/ML Solostar Pen Inject 30 Units into the skin daily. Patient not taking: Reported on 12/20/2023    [provider]  ipratropium (ATROVENT) 0.06 % nasal spray Place 2 sprays into both nostrils 4 (four) times daily. Patient not taking: Reported on 12/20/2023 10/22/23   Becky Augusta, NP  lisinopril (ZESTRIL) 20 MG tablet Take 20 mg by mouth in the morning and at bedtime. 08/01/23   [provider]  metFORMIN (GLUCOPHAGE) 1000 MG tablet Take 1,000 mg by mouth 2 (two) times daily with a meal.    [provider]  metoprolol succinate (TOPROL-XL) 50 MG 24 hr tablet Take 50 mg by mouth daily. 06/14/15   [provider]  Multiple Vitamins-Minerals (MULTIVITAMIN ADULTS 50+ PO) Take by mouth daily.    [provider]  OVER THE COUNTER MEDICATION Sciatic Ease, b12    [provider]  OZEMPIC, 0.25 OR 0.5 MG/DOSE, 2 MG/3ML SOPN 0.25 Units. 12/14/23   [provider]  promethazine-dextromethorphan (PROMETHAZINE-DM) 6.25-15 MG/5ML syrup Take 5 mLs by mouth 4 (four) times daily as needed. Patient not taking: Reported on 12/20/2023 10/22/23   Becky Augusta, NP  traMADol (ULTRAM) 50 MG tablet Take 50 mg by mouth every 6 (six) hours as needed (pain).  05/31/15   [provider]  traMADol (ULTRAM-ER) 300 MG 24 hr tablet Take 300 mg by mouth daily. Pt takes 300mg  in the morning 06/22/15   [provider]  Turmeric 500 MG TABS Take by mouth daily.    [provider]    Physical Exam   Triage Vital Signs: ED Triage Vitals  Encounter Vitals Group     BP 12/24/23 1731 (!) 144/81     Systolic BP Percentile --       Diastolic BP Percentile --      Pulse Rate 12/24/23 1731 77     Resp 12/24/23 1731 20     Temp 12/24/23 1731 98.2 F (36.8 C)     Temp Source 12/24/23 2010 Oral     SpO2 12/24/23 1731 100 %     Weight 12/24/23 1729 240 lb (108.9 kg)     Height 12/24/23 1729 6' (1.829 m)     Head Circumference --      Peak Flow --      Pain Score 12/24/23 1729 5     Pain Loc --      Pain Education --      Exclude from Growth Chart --     Most recent vital signs: Vitals:   12/25/23 0030 12/25/23 0221  BP: (!) 147/75 128/73  Pulse: 71 66  Resp: 16 11  Temp:    SpO2: 98% 99%    CONSTITUTIONAL: Alert, responds appropriately to questions. Well-appearing; well-nourished HEAD: Normocephalic, atraumatic EYES: Conjunctivae clear, pupils appear equal, sclera nonicteric ENT: normal nose; moist mucous membranes NECK: Supple, normal ROM CARD: RRR; S1 and S2 appreciated, chest wall nontender to palpation without crepitus or deformity RESP: Normal chest excursion without splinting or tachypnea; breath sounds clear and equal bilaterally; no wheezes, no rhonchi, no rales, no hypoxia or respiratory distress, speaking full sentences ABD/GI: Non-distended; soft, non-tender, no rebound, no guarding, no peritoneal signs BACK: The back appears normal, no midline spinal tenderness, step-off or deformity EXT: Normal ROM in all joints; no deformity noted, no edema, no calf tenderness or calf swelling SKIN: Normal color for age and race; warm; no rash on exposed skin NEURO: Moves all extremities equally, normal speech PSYCH: The patient's mood and manner are appropriate.   ED Results / Procedures / Treatments   LABS: (all labs ordered are listed, but only abnormal results are displayed) Labs Reviewed  BASIC METABOLIC PANEL - Abnormal; Notable for the following components:      Result Value   Creatinine, Ser 1.51 (*)    GFR, Estimated 52 (*)    All other components within normal limits  CBC - Abnormal;  Notable for the following components:   WBC 13.4 (*)    All other components within normal limits  RESP PANEL BY RT-PCR (RSV, FLU A&B, COVID)  RVPGX2  D-DIMER, QUANTITATIVE  TROPONIN I (HIGH SENSITIVITY)  TROPONIN I (HIGH SENSITIVITY)     EKG:  EKG Interpretation Date/Time:  Monday December 24 2023 17:30:39 EDT Ventricular Rate:  78 PR Interval:  162 QRS Duration:  92 QT Interval:  354 QTC Calculation: 403 R Axis:   -3  Text Interpretation: Normal sinus rhythm Minimal voltage criteria for LVH, may be normal variant ( R in aVL ) T wave abnormality, consider inferior ischemia Abnormal ECG When compared with ECG of 20-Dec-2023 10:46, No significant change was found Confirmed by Rochele Raring [phone]) on 12/24/2023 11:46:48 PM         EKG Interpretation Date/Time:  Monday December 24 2023 21:05:14 EDT Ventricular Rate:  80 PR Interval:  158 QRS Duration:  92 QT Interval:  362 QTC Calculation: 417 R Axis:   -14  Text Interpretation: Normal sinus rhythm Moderate voltage criteria for LVH, may be normal variant ( R in aVL , Cornell product ) Nonspecific T wave abnormality Abnormal ECG When compared with ECG of 24-Dec-2023 17:30, (unconfirmed) No significant change was found Confirmed by Rochele Raring [phone]) on 12/24/2023 11:46:54 PM        RADIOLOGY: My personal review and interpretation of imaging: Chest x-ray clear.  I have personally reviewed all radiology reports.   DG Chest 2 View Result Date: 12/24/2023 CLINICAL DATA:  Chest pain. EXAM: CHEST - 2 VIEW COMPARISON:  Radiograph and CT 12/19/2023 FINDINGS: The cardiomediastinal contours are normal. The lungs are clear. Pulmonary vasculature is normal. No consolidation, pleural effusion, or pneumothorax. No acute osseous abnormalities are seen. IMPRESSION: No active cardiopulmonary disease. Electronically Signed   By: Narda Rutherford M.D.   On: 12/24/2023 20:28     PROCEDURES:  Critical Care performed: No     .1-3 Lead EKG  Interpretation  Performed by: Daianna Vasques, Layla Maw, DO Authorized by: Renso Swett, Layla Maw, DO     Interpretation: normal     ECG rate:  66   ECG rate assessment: normal  Rhythm: sinus rhythm     Ectopy: none     Conduction: normal       IMPRESSION / MDM / ASSESSMENT AND PLAN / ED COURSE  I reviewed the triage vital signs and the nursing notes.    Patient here with chest pain.  Does have multiple risk factors for ACS.  The patient is on the cardiac monitor to evaluate for evidence of arrhythmia and/or significant heart rate changes.   DIFFERENTIAL DIAGNOSIS (includes but not limited to):   ACS, PE, pneumonia, pneumothorax, CHF, anxiety, indigestion, coronary artery vasospasm   Patient's presentation is most consistent with acute presentation with potential threat to life or bodily function.   PLAN: EKG shows T wave inversions but unchanged compared to prior.  No ST elevation.  Troponin x 2 negative.  Chest x-ray reviewed and interpreted by myself and the radiologist and is clear.  Patient does have a heart score of 4 and we have discussed the possibility of readmission however patient and wife are comfortable with the plan for discharge home with close follow-up with cardiology.  I did place an urgent referral to cardiology so that he can get set up for an outpatient stress test.  Echocardiogram on 12/20/2023 showed grade 1 diastolic dysfunction but otherwise reassuring.  Will obtain D-dimer, COVID and flu swab just to rule out other etiologies for his symptoms.  He is still having some mild discomfort.  Will give morphine and reassess.  Labs did show some mild AKI.  Will give 1 L of fluid.  He denies any vomiting or diarrhea.   MEDICATIONS GIVEN IN ED: Medications  sodium chloride 0.9 % bolus 1,000 mL (0 mLs Intravenous Stopped 12/25/23 0113)  morphine (PF) 4 MG/ML injection 4 mg (4 mg Intravenous Given 12/25/23 0108)  ondansetron (ZOFRAN) injection 4 mg (4 mg Intravenous Given 12/25/23  0110)     ED COURSE: Patient reports feeling better.  D-dimer negative.  COVID and flu negative.  Remains hemodynamically stable with no evidence seen on cardiac monitoring.  Patient to follow-up with his PCP this week as he already has an appointment scheduled.  Referral has been placed to cardiology.  He was also concerned because recent CT imaging showed a pulmonary nodule that was 3 mm.  I did place consult for pulmonary nodule clinic to follow this as well.   At this time, I do not feel there is any life-threatening condition present. I reviewed all nursing notes, vitals, pertinent previous records.  All lab and urine results, EKGs, imaging ordered have been independently reviewed and interpreted by myself.  I reviewed all available radiology reports from any imaging ordered this visit.  Based on my assessment, I feel the patient is safe to be discharged home without further emergent workup and can continue workup as an outpatient as needed. Discussed all findings, treatment plan as well as usual and customary return precautions.  They verbalize understanding and are comfortable with this plan.  Outpatient follow-up has been provided as needed.  All questions have been answered.    CONSULTS:  none   OUTSIDE RECORDS REVIEWED: Reviewed recent admission notes.       FINAL CLINICAL IMPRESSION(S) / ED DIAGNOSES   Final diagnoses:  Chest pain, unspecified type     Rx / DC Orders   ED Discharge Orders          Ordered    AMB  Referral to Pulmonary Nodule Clinic        12/25/23 0017  Ambulatory referral to Cardiology       Comments: If you have not heard from the Cardiology office within the next 72 hours please call [phone].   12/25/23 0017             Note:  This document was prepared using Dragon voice recognition software and may include unintentional dictation errors.   Jahliyah Trice, Layla Maw, DO 12/25/23 [phone]

## 2023-12-24 NOTE — ED Triage Notes (Signed)
 Pt comes with c/o cp that started today on left side. Pt states he broke out into sweats. Pt states he was here recently for same and dx with stroke. Pt states he just doesn't feel right.

## 2023-12-25 LAB — RESP PANEL BY RT-PCR (RSV, FLU A&B, COVID)  RVPGX2
Influenza A by PCR: NEGATIVE
Influenza B by PCR: NEGATIVE
Resp Syncytial Virus by PCR: NEGATIVE
SARS Coronavirus 2 by RT PCR: NEGATIVE

## 2023-12-25 LAB — D-DIMER, QUANTITATIVE: D-Dimer, Quant: 0.27 ug{FEU}/mL (ref 0.00–0.50)

## 2023-12-25 MED ORDER — MORPHINE SULFATE (PF) 4 MG/ML IV SOLN
4.0000 mg | Freq: Once | INTRAVENOUS | Status: AC
  Administered 2023-12-25: 4 mg via INTRAVENOUS
  Filled 2023-12-25: qty 1

## 2023-12-25 MED ORDER — ONDANSETRON HCL 4 MG/2ML IJ SOLN
4.0000 mg | Freq: Once | INTRAMUSCULAR | Status: AC
Start: 2023-12-25 — End: 2023-12-25
  Administered 2023-12-25: 4 mg via INTRAVENOUS
  Filled 2023-12-25: qty 2

## 2023-12-25 NOTE — Progress Notes (Deleted)
  Cardiology Office Note:  .   Date:  12/25/2023  ID:  Anthony Harvey, DOB September 03, 1961, MRN [id number] PCP: Sherrie Mustache, MD  [facility] Providers Cardiologist:  None {  History of Present Illness: .   Anthony Harvey is a 63 y.o. male with a history of HTN, HLD, diabetes who presents as a new patient.   The patient was admitted early March 2025 with left-sided numbness and chest pain and palpitations. Stroke work-up was negative. Tele showed no Afib. Echo showed normal LVEF 60-65%, no WMA, G1DD.  He was seen in the ER 12/24/23 with chest pain. BP was mildly elevated. HS troponin was negative. CXR clear. Labs showed AKI and he was given fluids. He was discharged with cardiology referral.   Today,   ROS: ***  Studies Reviewed: .        *** Risk Assessment/Calculations:   {Does this patient have ATRIAL FIBRILLATION?:[phone]}         Physical Exam:   VS:  There were no vitals taken for this visit.   Wt Readings from Last 3 Encounters:  12/24/23 240 lb (108.9 kg)  12/19/23 244 lb 11.4 oz (111 kg)  10/22/23 245 lb (111.1 kg)    GEN: Well nourished, well developed in no acute distress NECK: No JVD; No carotid bruits CARDIAC: ***RRR, no murmurs, rubs, gallops RESPIRATORY:  Clear to auscultation without rales, wheezing or rhonchi  ABDOMEN: Soft, non-tender, non-distended EXTREMITIES:  No edema; No deformity   ASSESSMENT AND PLAN: .   ***    {Are you ordering a CV Procedure (e.g. stress test, cath, DCCV, TEE, etc)?   Press F2        :[id number]}  Dispo: ***  Signed, Netra Postlethwait David Stall, PA-C

## 2023-12-26 ENCOUNTER — Other Ambulatory Visit: Payer: Self-pay | Admitting: Internal Medicine

## 2023-12-26 DIAGNOSIS — I499 Cardiac arrhythmia, unspecified: Secondary | ICD-10-CM

## 2023-12-27 ENCOUNTER — Ambulatory Visit (INDEPENDENT_AMBULATORY_CARE_PROVIDER_SITE_OTHER)

## 2023-12-27 ENCOUNTER — Ambulatory Visit: Admitting: Medical

## 2023-12-27 DIAGNOSIS — I499 Cardiac arrhythmia, unspecified: Secondary | ICD-10-CM | POA: Diagnosis not present

## 2023-12-31 MED ORDER — TECHNETIUM TC 99M SESTAMIBI GENERIC - CARDIOLITE
10.8000 | Freq: Once | INTRAVENOUS | Status: AC | PRN
  Administered 2023-12-27: 10.8 via INTRAVENOUS

## 2023-12-31 MED ORDER — TECHNETIUM TC 99M SESTAMIBI GENERIC - CARDIOLITE
30.8000 | Freq: Once | INTRAVENOUS | Status: AC | PRN
  Administered 2023-12-27: 30.8 via INTRAVENOUS

## 2024-01-01 ENCOUNTER — Encounter: Payer: Self-pay | Admitting: Oncology

## 2024-01-01 ENCOUNTER — Inpatient Hospital Stay

## 2024-01-01 ENCOUNTER — Inpatient Hospital Stay: Attending: Oncology | Admitting: Oncology

## 2024-01-01 VITALS — BP 149/91 | HR 68 | Temp 96.9°F | Resp 16 | Ht 72.0 in | Wt 246.1 lb

## 2024-01-01 DIAGNOSIS — D72820 Lymphocytosis (symptomatic): Secondary | ICD-10-CM | POA: Diagnosis present

## 2024-01-01 DIAGNOSIS — Z7984 Long term (current) use of oral hypoglycemic drugs: Secondary | ICD-10-CM | POA: Insufficient documentation

## 2024-01-01 DIAGNOSIS — E119 Type 2 diabetes mellitus without complications: Secondary | ICD-10-CM | POA: Insufficient documentation

## 2024-01-01 DIAGNOSIS — I10 Essential (primary) hypertension: Secondary | ICD-10-CM | POA: Insufficient documentation

## 2024-01-01 LAB — CBC (CANCER CENTER ONLY)
HCT: 45.8 % (ref 39.0–52.0)
Hemoglobin: 15.4 g/dL (ref 13.0–17.0)
MCH: 28.3 pg (ref 26.0–34.0)
MCHC: 33.6 g/dL (ref 30.0–36.0)
MCV: 84 fL (ref 80.0–100.0)
Platelet Count: 224 10*3/uL (ref 150–400)
RBC: 5.45 MIL/uL (ref 4.22–5.81)
RDW: 13.9 % (ref 11.5–15.5)
WBC Count: 10.6 10*3/uL — ABNORMAL HIGH (ref 4.0–10.5)
nRBC: 0 % (ref 0.0–0.2)

## 2024-01-01 LAB — IRON AND TIBC
Iron: 84 ug/dL (ref 45–182)
Saturation Ratios: 15 % — ABNORMAL LOW (ref 17.9–39.5)
TIBC: 580 ug/dL — ABNORMAL HIGH (ref 250–450)
UIBC: 496 ug/dL

## 2024-01-01 LAB — FOLATE: Folate: >40 ng/mL (ref >5.9)

## 2024-01-01 LAB — VITAMIN B12: Vitamin B-12: 1447 pg/mL — ABNORMAL HIGH (ref 180–914)

## 2024-01-01 LAB — FERRITIN: Ferritin: 26 ng/mL (ref 24–336)

## 2024-01-01 LAB — LACTATE DEHYDROGENASE: LDH: 110 U/L (ref 98–192)

## 2024-01-01 NOTE — Progress Notes (Signed)
 [facility]  [phone] [phone]  ID: [name] OB: 1961-04-11  MR#: [id number]  NGE#:[id number]  Patient Care Team: Sherrie Mustache, MD as PCP - General (Internal Medicine) Alan Mulder, MD as Attending Physician (Endocrinology) Lemar Livings Merrily Pew, MD (General Surgery)  CHIEF COMPLAINT: Lymphocytosis.  INTERVAL HISTORY: Patient is a 63 year old male who was noted to have an elevated white blood cell count, lymphocytosis and smudge cells incidentally on routine blood work.  He currently feels well and is asymptomatic.  He has no neurologic complaints.  He denies any recent fevers or illnesses.  He has a good appetite and denies weight loss.  He has no chest pain, shortness of breath, cough, or hemoptysis.  He denies any nausea, vomiting, constipation, or diarrhea.  He has no urinary complaints.  Patient offers no specific complaints today.  REVIEW OF SYSTEMS:   Review of Systems  Constitutional: Negative.  Negative for fever, malaise/fatigue and weight loss.  Respiratory: Negative.  Negative for cough, hemoptysis and shortness of breath.   Cardiovascular: Negative.  Negative for chest pain and leg swelling.  Gastrointestinal: Negative.  Negative for abdominal pain.  Genitourinary: Negative.  Negative for dysuria.  Musculoskeletal: Negative.  Negative for back pain.  Skin: Negative.  Negative for rash.  Neurological: Negative.  Negative for dizziness, focal weakness, weakness and headaches.  Psychiatric/Behavioral: Negative.  The patient is not nervous/anxious.     As per HPI. Otherwise, a complete review of systems is negative.  PAST MEDICAL HISTORY: Past Medical History:  Diagnosis Date   Arthritis    Diabetes mellitus without complication (HCC)    GERD (gastroesophageal reflux disease)    Hx of back injury 2010   fell from piping and hit back   Hyperlipidemia    Hypertension    Iron deficiency anemia    Sciatica of left  side    Vertigo    rare    PAST SURGICAL HISTORY: Past Surgical History:  Procedure Laterality Date   COLONOSCOPY WITH PROPOFOL N/A 09/10/2023   Procedure: COLONOSCOPY WITH PROPOFOL;  Surgeon: Midge Minium, MD;  Location: [facility];  Service: Endoscopy;  Laterality: N/A;   ESOPHAGOGASTRODUODENOSCOPY (EGD) WITH PROPOFOL N/A 09/10/2023   Procedure: ESOPHAGOGASTRODUODENOSCOPY (EGD) WITH PROPOFOL;  Surgeon: Midge Minium, MD;  Location: [facility];  Service: Endoscopy;  Laterality: N/A;  Diabetic   GIVENS CAPSULE STUDY N/A 09/26/2023   Procedure: GIVENS CAPSULE STUDY;  Surgeon: Midge Minium, MD;  Location: [facility];  Service: Endoscopy;  Laterality: N/A;   HERNIA REPAIR  08/03/2008   Right direct inguinal hernia, large Ultra Pro mesh.   KNEE ARTHROSCOPY Bilateral    KNEE ARTHROSCOPY Right 11/16/2016   Procedure: ARTHROSCOPY KNEE, lateral release, partial meniscectomy;  Surgeon: Kennedy Bucker, MD;  Location: [facility];  Service: Orthopedics;  Laterality: Right;   POLYPECTOMY  09/10/2023   Procedure: POLYPECTOMY;  Surgeon: Midge Minium, MD;  Location: [facility];  Service: Endoscopy;;   TONSILLECTOMY  as child    FAMILY HISTORY: Family History  Problem Relation Age of Onset   Cancer Mother    Diabetes Mother    Cancer Father     ADVANCED DIRECTIVES (Y/N):  N  HEALTH MAINTENANCE: Social History   Tobacco Use   Smoking status: Never   Smokeless tobacco: Never  Vaping Use   Vaping status: Never Used  Substance Use Topics   Alcohol use: No    Alcohol/week: 0.0 standard drinks of alcohol   Drug use: No  Colonoscopy:  PAP:  Bone density:  Lipid panel:  Allergies  Allergen Reactions   Cymbalta [Duloxetine Hcl] Other (See Comments)    Chest pain   Duloxetine Other (See Comments)   Pregabalin     Other reaction(s): Vertigo, speech problems   Celebrex [Celecoxib] Palpitations   Niacin Hives and Rash    Current Outpatient Medications   Medication Sig Dispense Refill   aspirin EC 81 MG tablet Take 81 mg by mouth at bedtime.     cyanocobalamin (VITAMIN B12) 1000 MCG tablet Take 1,000 mcg by mouth daily.     cyclobenzaprine (FLEXERIL) 5 MG tablet Take 5 mg by mouth once.     docusate sodium (COLACE) 100 MG capsule Take 100 mg by mouth at bedtime. Twice a day     ezetimibe (ZETIA) 10 MG tablet Take 10 mg by mouth daily.     fenofibrate (TRICOR) 145 MG tablet Take 145 mg by mouth daily.     glipiZIDE (GLUCOTROL XL) 5 MG 24 hr tablet Take 5 mg by mouth daily.     insulin glargine, 1 Unit Dial, (TOUJEO SOLOSTAR) 300 UNIT/ML Solostar Pen Inject 30 Units into the skin daily.     lisinopril (ZESTRIL) 20 MG tablet Take 20 mg by mouth in the morning and at bedtime.     metFORMIN (GLUCOPHAGE) 1000 MG tablet Take 1,000 mg by mouth 2 (two) times daily with a meal.     metoprolol succinate (TOPROL-XL) 50 MG 24 hr tablet Take 50 mg by mouth daily.     Multiple Vitamins-Minerals (MULTIVITAMIN ADULTS 50+ PO) Take by mouth daily.     OVER THE COUNTER MEDICATION Sciatic Ease, b12     OZEMPIC, 0.25 OR 0.5 MG/DOSE, 2 MG/3ML SOPN 0.25 Units.     traMADol (ULTRAM) 50 MG tablet Take 50 mg by mouth every 6 (six) hours as needed (pain).      traMADol (ULTRAM-ER) 300 MG 24 hr tablet Take 300 mg by mouth daily. Pt takes 300mg  in the morning     Turmeric 500 MG TABS Take by mouth daily.     benzonatate (TESSALON) 100 MG capsule Take 2 capsules (200 mg total) by mouth every 8 (eight) hours. (Patient not taking: Reported on 12/20/2023) 21 capsule 0   ergocalciferol (VITAMIN D2) 1.25 MG (50000 UT) capsule Take 50,000 Units by mouth once a week. Saturday (Patient not taking: Reported on 12/20/2023)     FARXIGA 10 MG TABS tablet Take 10 mg by mouth daily. (Patient not taking: Reported on 12/20/2023)     hydrALAZINE (APRESOLINE) 25 MG tablet Take 25 mg by mouth 2 (two) times daily. (Patient not taking: Reported on 01/01/2024)     ipratropium (ATROVENT) 0.06 % nasal  spray Place 2 sprays into both nostrils 4 (four) times daily. (Patient not taking: Reported on 12/20/2023) 15 mL 12   promethazine-dextromethorphan (PROMETHAZINE-DM) 6.25-15 MG/5ML syrup Take 5 mLs by mouth 4 (four) times daily as needed. (Patient not taking: Reported on 12/20/2023) 118 mL 0   No current facility-administered medications for this visit.    OBJECTIVE: Vitals:   01/01/24 1125  BP: (!) 149/91  Pulse: 68  Resp: 16  Temp: (!) 96.9 F (36.1 C)  SpO2: 100%     Body mass index is 33.38 kg/m.    ECOG FS:0 - Asymptomatic  General: Well-developed, well-nourished, no acute distress. Eyes: Pink conjunctiva, anicteric sclera. HEENT: Normocephalic, moist mucous membranes. Lungs: No audible wheezing or coughing. Heart: Regular rate and rhythm. Abdomen: Soft,  nontender, no obvious distention. Musculoskeletal: No edema, cyanosis, or clubbing. Neuro: Alert, answering all questions appropriately. Cranial nerves grossly intact. Skin: No rashes or petechiae noted. Psych: Normal affect. Lymphatics: No cervical, calvicular, axillary or inguinal LAD.   LAB RESULTS:  Lab Results  Component Value Date   NA 137 12/24/2023   K 4.4 12/24/2023   CL 101 12/24/2023   CO2 26 12/24/2023   GLUCOSE 97 12/24/2023   BUN 22 12/24/2023   CREATININE 1.51 (H) 12/24/2023   CALCIUM 9.7 12/24/2023   PROT 6.6 12/19/2023   ALBUMIN 4.4 12/19/2023   AST 26 12/19/2023   ALT 29 12/19/2023   ALKPHOS 39 12/19/2023   BILITOT 0.5 12/19/2023   GFRNONAA 52 (L) 12/24/2023   GFRAA >60 07/12/2012    Lab Results  Component Value Date   WBC 13.4 (H) 12/24/2023   NEUTROABS 3.2 12/19/2023   HGB 15.4 12/24/2023   HCT 45.5 12/24/2023   MCV 83.8 12/24/2023   PLT 214 12/24/2023     STUDIES: DG Chest 2 View Result Date: 12/24/2023 CLINICAL DATA:  Chest pain. EXAM: CHEST - 2 VIEW COMPARISON:  Radiograph and CT 12/19/2023 FINDINGS: The cardiomediastinal contours are normal. The lungs are clear. Pulmonary  vasculature is normal. No consolidation, pleural effusion, or pneumothorax. No acute osseous abnormalities are seen. IMPRESSION: No active cardiopulmonary disease. Electronically Signed   By: Narda Rutherford M.D.   On: 12/24/2023 20:28   ECHOCARDIOGRAM COMPLETE Result Date: 12/20/2023    ECHOCARDIOGRAM REPORT   Patient Name:   DAI MCADAMS Date of Exam: 12/20/2023 Medical Rec #:  [id number]        Height:       72.0 in Accession #:    [id number]       Weight:       244.7 lb Date of Birth:  1960/11/12        BSA:          2.321 m Patient Age:    62 years         BP:           113/60 mmHg Patient Gender: M                HR:           67 bpm. Exam Location:  [facility] Procedure: 2D Echo, Cardiac Doppler, Color Doppler and Strain Analysis (Both            Spectral and Color Flow Doppler were utilized during procedure). Indications:     Chest Pain  History:         Patient has no prior history of Echocardiogram examinations.                  Signs/Symptoms:Chest Pain; Risk Factors:Hypertension.  Sonographer:     Mikki Harbor Referring Phys:  [id number] Emeline General Diagnosing Phys: Alwyn Pea MD  Sonographer Comments: Suboptimal subcostal window. Image acquisition challenging due to respiratory motion. Global longitudinal strain was attempted. IMPRESSIONS  1. Left ventricular ejection fraction, by estimation, is 60 to 65%. The left ventricle has normal function. The left ventricle has no regional wall motion abnormalities. Left ventricular diastolic parameters are consistent with Grade I diastolic dysfunction (impaired relaxation). The average left ventricular global longitudinal strain is 15.3 %. The global longitudinal strain is abnormal.  2. Right ventricular systolic function is normal. The right ventricular size is normal.  3. The mitral valve is normal in structure. No evidence of mitral valve regurgitation.  4. The aortic valve is normal in structure. Aortic valve regurgitation is not visualized. FINDINGS   Left Ventricle: Left ventricular ejection fraction, by estimation, is 60 to 65%. The left ventricle has normal function. The left ventricle has no regional wall motion abnormalities. The average left ventricular global longitudinal strain is 15.3 %. Strain was performed and the global longitudinal strain is abnormal. The left ventricular internal cavity size was normal in size. There is no left ventricular hypertrophy. Left ventricular diastolic parameters are consistent with Grade I diastolic dysfunction (impaired relaxation). Right Ventricle: The right ventricular size is normal. No increase in right ventricular wall thickness. Right ventricular systolic function is normal. Left Atrium: Left atrial size was normal in size. Right Atrium: Right atrial size was normal in size. Pericardium: There is no evidence of pericardial effusion. Mitral Valve: The mitral valve is normal in structure. No evidence of mitral valve regurgitation. MV peak gradient, 2.8 mmHg. The mean mitral valve gradient is 1.0 mmHg. Tricuspid Valve: The tricuspid valve is normal in structure. Tricuspid valve regurgitation is not demonstrated. Aortic Valve: The aortic valve is normal in structure. Aortic valve regurgitation is not visualized. Aortic valve mean gradient measures 2.0 mmHg. Aortic valve peak gradient measures 5.6 mmHg. Aortic valve area, by VTI measures 4.44 cm. Pulmonic Valve: The pulmonic valve was normal in structure. Pulmonic valve regurgitation is not visualized. Aorta: The ascending aorta was not well visualized. IAS/Shunts: No atrial level shunt detected by color flow Doppler. Additional Comments: 3D was performed not requiring image post processing on an independent workstation and was indeterminate.  LEFT VENTRICLE PLAX 2D LVIDd:         4.70 cm     Diastology LVIDs:         3.10 cm     LV e' medial:    7.94 cm/s LV PW:         1.00 cm     LV E/e' medial:  8.5 LV IVS:        1.10 cm     LV e' lateral:   10.10 cm/s LVOT diam:      2.30 cm     LV E/e' lateral: 6.7 LV SV:         92 LV SV Index:   40          2D Longitudinal Strain LVOT Area:     4.15 cm    2D Strain GLS (A4C):   15.1 %                            2D Strain GLS (A3C):   15.8 %                            2D Strain GLS (A2C):   14.9 % LV Volumes (MOD)           2D Strain GLS Avg:     15.3 % LV vol d, MOD A2C: 92.5 ml LV vol d, MOD A4C: 89.8 ml LV vol s, MOD A2C: 47.3 ml LV vol s, MOD A4C: 30.9 ml LV SV MOD A2C:     45.2 ml LV SV MOD A4C:     89.8 ml LV SV MOD BP:      55.5 ml RIGHT VENTRICLE RV Basal diam:  4.30 cm RV Mid diam:    3.90 cm RV S prime:     13.50 cm/s TAPSE (  M-mode): 2.8 cm LEFT ATRIUM             Index        RIGHT ATRIUM           Index LA diam:        3.80 cm 1.64 cm/m   RA Area:     12.20 cm LA Vol (A2C):   81.1 ml 34.94 ml/m  RA Volume:   24.50 ml  10.55 ml/m LA Vol (A4C):   38.5 ml 16.59 ml/m LA Biplane Vol: 59.4 ml 25.59 ml/m  AORTIC VALVE                    PULMONIC VALVE AV Area (Vmax):    3.39 cm     PV Vmax:       1.10 m/s AV Area (Vmean):   3.75 cm     PV Peak grad:  4.8 mmHg AV Area (VTI):     4.44 cm AV Vmax:           118.00 cm/s AV Vmean:          65.400 cm/s AV VTI:            0.207 m AV Peak Grad:      5.6 mmHg AV Mean Grad:      2.0 mmHg LVOT Vmax:         96.30 cm/s LVOT Vmean:        59.100 cm/s LVOT VTI:          0.221 m LVOT/AV VTI ratio: 1.07  AORTA Ao Root diam: 3.70 cm MITRAL VALVE MV Area (PHT): 3.26 cm    SHUNTS MV Area VTI:   3.22 cm    Systemic VTI:  0.22 m MV Peak grad:  2.8 mmHg    Systemic Diam: 2.30 cm MV Mean grad:  1.0 mmHg MV Vmax:       0.84 m/s MV Vmean:      48.7 cm/s MV Decel Time: 233 msec MV E velocity: 67.30 cm/s MV A velocity: 68.10 cm/s MV E/A ratio:  0.99 Dwayne D Callwood MD Electronically signed by Alwyn Pea MD Signature Date/Time: 12/20/2023/4:19:52 PM    Final    MR ANGIO HEAD WO CONTRAST Result Date: 12/20/2023 CLINICAL DATA:  63 year old male with neurologic deficit.  TIA. EXAM: MRA HEAD  WITHOUT CONTRAST MRA NECK WITHOUT CONTRAST TECHNIQUE: Angiographic images of the Circle of Willis were obtained using MRA technique without intravenous contrast. Angiographic images of the neck were obtained using MRA technique without intravenous contrast. Carotid stenosis measurements (when applicable) are obtained utilizing NASCET criteria, using the distal internal carotid diameter as the denominator. COMPARISON:  Brain MRI 0212 hours today. FINDINGS: MRA NECK FINDINGS Antegrade flow in both cervical carotid arteries, through both carotid bifurcations and to the skull base. Visible proximal ICA siphons also appear patent and normal. Probable 3 vessel arch configuration. Antegrade flow in both cervical vertebral arteries, the right appears dominant and the left is diminutive. But antegrade flow in both vessels continues to the skull base. No evidence of stenosis. And similar appearance of the proximal vertebral arteries on CTA chest yesterday. MRA HEAD FINDINGS Antegrade flow in the posterior circulation, with these images beginning at the vertebrobasilar junction. Vertebral artery V4 segments and vertebrobasilar junction are normal on the neck MRA images. Patent basilar artery. Right PICA and dominant appearing left AICA. Basilar tip, SCA and PCA origins are normal. Posterior communicating arteries are diminutive or absent. Bilateral  PCA branches are within normal limits. Antegrade flow in both ICA siphons. No siphon stenosis. Patent carotid termini. Normal MCA and ACA origins. Normal anterior communicating artery and visible ACA branches. Bilateral MCA M1 segments and MCA bifurcations appear patent without stenosis. Bilateral MCA branches are within normal limits. No evidence of intracranial mass effect or ventriculomegaly on these images, cavum vergae a variant again noted. IMPRESSION: Negative noncontrast Head and Neck MRA. Electronically Signed   By: Odessa Fleming M.D.   On: 12/20/2023 12:50   MR ANGIO NECK WO  CONTRAST Result Date: 12/20/2023 CLINICAL DATA:  63 year old male with neurologic deficit.  TIA. EXAM: MRA HEAD WITHOUT CONTRAST MRA NECK WITHOUT CONTRAST TECHNIQUE: Angiographic images of the Circle of Willis were obtained using MRA technique without intravenous contrast. Angiographic images of the neck were obtained using MRA technique without intravenous contrast. Carotid stenosis measurements (when applicable) are obtained utilizing NASCET criteria, using the distal internal carotid diameter as the denominator. COMPARISON:  Brain MRI 0212 hours today. FINDINGS: MRA NECK FINDINGS Antegrade flow in both cervical carotid arteries, through both carotid bifurcations and to the skull base. Visible proximal ICA siphons also appear patent and normal. Probable 3 vessel arch configuration. Antegrade flow in both cervical vertebral arteries, the right appears dominant and the left is diminutive. But antegrade flow in both vessels continues to the skull base. No evidence of stenosis. And similar appearance of the proximal vertebral arteries on CTA chest yesterday. MRA HEAD FINDINGS Antegrade flow in the posterior circulation, with these images beginning at the vertebrobasilar junction. Vertebral artery V4 segments and vertebrobasilar junction are normal on the neck MRA images. Patent basilar artery. Right PICA and dominant appearing left AICA. Basilar tip, SCA and PCA origins are normal. Posterior communicating arteries are diminutive or absent. Bilateral PCA branches are within normal limits. Antegrade flow in both ICA siphons. No siphon stenosis. Patent carotid termini. Normal MCA and ACA origins. Normal anterior communicating artery and visible ACA branches. Bilateral MCA M1 segments and MCA bifurcations appear patent without stenosis. Bilateral MCA branches are within normal limits. No evidence of intracranial mass effect or ventriculomegaly on these images, cavum vergae a variant again noted. IMPRESSION: Negative  noncontrast Head and Neck MRA. Electronically Signed   By: Odessa Fleming M.D.   On: 12/20/2023 12:50   DG Foot 2 Views Left Result Date: 12/20/2023 CLINICAL DATA:  Left foot pain. EXAM: LEFT FOOT - 2 VIEW COMPARISON:  Left ankle radiographs 12/14/2008 FINDINGS: Mild plantar and mild right posterior calcaneal heel spurs, both mildly increased from 12/14/2008 remote radiographs. Increased mild degenerative spurring at the dorsal aspect of the tarsometatarsal joints on lateral view, likely the first or second tarsometatarsal joint. There are two ossicles measuring up to 3 mm at the proximal aspect of the medial great toe metatarsophalangeal sesamoid on lateral view. This is suggestive of a normal variant multipartite sesamoid. Mild chronic enthesopathic change at the peroneus brevis insertion on the base of the fifth metatarsal. No acute fracture or dislocation. IMPRESSION: 1. Mild plantar and mild posterior calcaneal heel spurs, both mildly increased from 12/14/2008 remote radiographs. 2. Mild degenerative spurring at the dorsal aspect of the first or second tarsometatarsal joint on lateral view. Electronically Signed   By: Neita Garnet M.D.   On: 12/20/2023 11:06   MR BRAIN WO CONTRAST Result Date: 12/20/2023 CLINICAL DATA:  Neuro deficit, acute, stroke suspected EXAM: MRI HEAD WITHOUT CONTRAST TECHNIQUE: Multiplanar, multiecho pulse sequences of the brain and surrounding structures were obtained without intravenous  contrast. COMPARISON:  CT head March 5, 25. FINDINGS: Brain: No acute infarction, hemorrhage, hydrocephalus, extra-axial collection or mass lesion. Vascular: Major arterial flow voids are maintained at the skull base. Skull and upper cervical spine: Normal marrow signal. Sinuses/Orbits: Mild paranasal sinus mucosal thickening. Left maxillary sinus retention cyst. No acute orbital findings. Other: No mastoid effusions. IMPRESSION: No acute abnormality. Electronically Signed   By: Feliberto Harts M.D.    On: 12/20/2023 02:31   CT Angio Chest Aorta W and/or Wo Contrast Result Date: 12/20/2023 CLINICAL DATA:  Left-sided chest pain EXAM: CT ANGIOGRAPHY CHEST WITH CONTRAST TECHNIQUE: Multidetector CT imaging of the chest was performed using the standard protocol during bolus administration of intravenous contrast. Multiplanar CT image reconstructions and MIPs were obtained to evaluate the vascular anatomy. RADIATION DOSE REDUCTION: This exam was performed according to the departmental dose-optimization program which includes automated exposure control, adjustment of the mA and/or kV according to patient size and/or use of iterative reconstruction technique. CONTRAST:  OMNIPAQUE IOHEXOL 350 MG/ML SOLN COMPARISON:  None Available. FINDINGS: Cardiovascular: Initial precontrast images show no hyperdense crescent to suggest acute aortic injury. Post-contrast images show no evidence of aneurysmal dilatation or dissection. Heart is not significantly enlarged in size. Pulmonary artery shows a normal branching pattern bilaterally. No intraluminal filling defect to suggest pulmonary embolism is seen. Mediastinum/Nodes: Thoracic inlet is within normal limits. No hilar or mediastinal adenopathy is noted. The esophagus as visualized is within normal limits. Lungs/Pleura: Lungs are well aerated bilaterally. No focal infiltrate or sizable effusion is seen. A few small subpleural nodules are noted measuring less than 4 mm. No sizable parenchymal nodule is noted. Upper Abdomen: Visualized upper abdomen shows no acute abnormality Musculoskeletal: Bony structures are within normal limits. Review of the MIP images confirms the above findings. IMPRESSION: No evidence of pulmonary emboli. No acute aortic abnormality is noted. Multiple pulmonary nodules. Most significant: Solid pulmonary nodule measuring 3 mm. Per Fleischner Society Guidelines, no routine follow-up imaging is recommended. These guidelines do not apply to  immunocompromised patients and patients with cancer. Follow up in patients with significant comorbidities as clinically warranted. For lung cancer screening, adhere to Lung-RADS guidelines. Reference: Radiology. 2017; 284(1):228-43. Aortic Atherosclerosis (ICD10-I70.0). Electronically Signed   By: Alcide Clever M.D.   On: 12/20/2023 00:45   CT HEAD WO CONTRAST ( ) Result Date: 12/20/2023 CLINICAL DATA:  Mental status change, unknown cause EXAM: CT HEAD WITHOUT CONTRAST TECHNIQUE: Contiguous axial images were obtained from the base of the skull through the vertex without intravenous contrast. RADIATION DOSE REDUCTION: This exam was performed according to the departmental dose-optimization program which includes automated exposure control, adjustment of the mA and/or kV according to patient size and/or use of iterative reconstruction technique. COMPARISON:  None Available. FINDINGS: Brain: No evidence of acute infarction, hemorrhage, hydrocephalus, extra-axial collection or mass lesion/mass effect. Vascular: No hyperdense vessel. Skull: No acute fracture. Sinuses/Orbits: Mostly clear sinuses.  No acute orbital findings. Other: No mastoid effusions. IMPRESSION: No evidence of acute intracranial abnormality. Electronically Signed   By: Feliberto Harts M.D.   On: 12/20/2023 00:26   DG Chest Portable 1 View Result Date: 12/20/2023 CLINICAL DATA:  Left-sided chest pain and left arm numbness. EXAM: PORTABLE CHEST 1 VIEW COMPARISON:  July 12, 2012 FINDINGS: The heart size and mediastinal contours are within normal limits. Both lungs are clear. The visualized skeletal structures are unremarkable. IMPRESSION: No active disease. Electronically Signed   By: Aram Candela M.D.   On: 12/20/2023 00:09  ASSESSMENT: Lymphocytosis.  PLAN:    Lymphocytosis: Patient only has a mildly elevated total white blood cell count of 10.6, but was noted to have smudge cells on previous lab draw.  Have ordered peripheral  blood flow cytometry for completeness.  All of his other laboratory work is also pending at time of dictation.  No further interventions are needed.  Patient had a CT scan of the chest on December 19, 2023 that did not report any lymphadenopathy.  Patient does not require additional imaging or bone marrow biopsy at this time.  He will have video-assisted telemedicine visit in 3 weeks to discuss the results.  I spent a total of 45 minutes reviewing chart data, face-to-face evaluation with the patient, counseling and coordination of care as detailed above.  Patient expressed understanding and was in agreement with this plan. He also understands that He can call clinic at any time with any questions, concerns, or complaints.    Cancer Staging  No matching staging information was found for the patient.   Jeralyn Ruths, MD   01/01/2024 12:13 PM

## 2024-01-02 ENCOUNTER — Encounter: Payer: Self-pay | Admitting: Oncology

## 2024-01-04 LAB — COMP PANEL: LEUKEMIA/LYMPHOMA

## 2024-01-07 LAB — FISH HES LEUKEMIA, 4Q12 REA

## 2024-01-25 ENCOUNTER — Inpatient Hospital Stay: Attending: Oncology | Admitting: Oncology

## 2024-02-01 ENCOUNTER — Other Ambulatory Visit: Payer: Self-pay | Admitting: Internal Medicine

## 2024-02-01 ENCOUNTER — Inpatient Hospital Stay: Attending: Oncology | Admitting: Oncology

## 2024-02-01 DIAGNOSIS — C9110 Chronic lymphocytic leukemia of B-cell type not having achieved remission: Secondary | ICD-10-CM | POA: Insufficient documentation

## 2024-02-01 DIAGNOSIS — R0602 Shortness of breath: Secondary | ICD-10-CM

## 2024-02-01 DIAGNOSIS — R0789 Other chest pain: Secondary | ICD-10-CM

## 2024-02-01 NOTE — Progress Notes (Signed)
 [facility]  [phone] [phone]  ID: Anthony Harvey OB: 08-26-1961  MR#: [id number]  AOZ#:[id number]  Patient Care Team: Annelle Kiel, MD as PCP - General (Internal Medicine) Giovanna Lake, MD as Attending Physician (Endocrinology) Shellie Dials, MD as Consulting Physician (Oncology)  I connected with Anthony Harvey on 02/01/24 at 11:15 AM EDT by video enabled telemedicine visit and verified that I am speaking with the correct person using two identifiers.   I discussed the limitations, risks, security and privacy concerns of performing an evaluation and management service by telemedicine and the availability of in-person appointments. I also discussed with the patient that there may be a patient responsible charge related to this service. The patient expressed understanding and agreed to proceed.   Other persons participating in the visit and their role in the encounter: Patient, MD.  Patient's location: Home. Provider's location: Clinic.  CHIEF COMPLAINT: CLL, Rai stage 0.  INTERVAL HISTORY: Patient agreed to video-assisted telemedicine visit for further evaluation and discussion of his laboratory results.  He continues to feel well and remains asymptomatic. He has no neurologic complaints.  He denies any recent fevers or illnesses.  He has a good appetite and denies weight loss.  He has no chest pain, shortness of breath, cough, or hemoptysis.  He denies any nausea, vomiting, constipation, or diarrhea.  He has no urinary complaints.  Patient offers no specific complaints today.  REVIEW OF SYSTEMS:   Review of Systems  Constitutional: Negative.  Negative for fever, malaise/fatigue and weight loss.  Respiratory: Negative.  Negative for cough, hemoptysis and shortness of breath.   Cardiovascular: Negative.  Negative for chest pain and leg swelling.  Gastrointestinal: Negative.  Negative for abdominal pain.  Genitourinary:  Negative.  Negative for dysuria.  Musculoskeletal: Negative.  Negative for back pain.  Skin: Negative.  Negative for rash.  Neurological: Negative.  Negative for dizziness, focal weakness, weakness and headaches.  Psychiatric/Behavioral: Negative.  The patient is not nervous/anxious.     As per HPI. Otherwise, a complete review of systems is negative.  PAST MEDICAL HISTORY: Past Medical History:  Diagnosis Date   Arthritis    Diabetes mellitus without complication (HCC)    GERD (gastroesophageal reflux disease)    Hx of back injury 2010   fell from piping and hit back   Hyperlipidemia    Hypertension    Iron deficiency anemia    Sciatica of left side    Vertigo    rare    PAST SURGICAL HISTORY: Past Surgical History:  Procedure Laterality Date   COLONOSCOPY WITH PROPOFOL  N/A 09/10/2023   Procedure: COLONOSCOPY WITH PROPOFOL ;  Surgeon: Marnee Sink, MD;  Location: [facility];  Service: Endoscopy;  Laterality: N/A;   ESOPHAGOGASTRODUODENOSCOPY (EGD) WITH PROPOFOL  N/A 09/10/2023   Procedure: ESOPHAGOGASTRODUODENOSCOPY (EGD) WITH PROPOFOL ;  Surgeon: Marnee Sink, MD;  Location: [facility];  Service: Endoscopy;  Laterality: N/A;  Diabetic   GIVENS CAPSULE STUDY N/A 09/26/2023   Procedure: GIVENS CAPSULE STUDY;  Surgeon: Marnee Sink, MD;  Location: [facility];  Service: Endoscopy;  Laterality: N/A;   HERNIA REPAIR  08/03/2008   Right direct inguinal hernia, large Ultra Pro mesh.   KNEE ARTHROSCOPY Bilateral    KNEE ARTHROSCOPY Right 11/16/2016   Procedure: ARTHROSCOPY KNEE, lateral release, partial meniscectomy;  Surgeon: Molli Angelucci, MD;  Location: [facility] OR[facility];  Service: Orthopedics;  Laterality: Right;   POLYPECTOMY  09/10/2023   Procedure: POLYPECTOMY;  Surgeon: Marnee Sink, MD;  Location: [facility];  Service: Endoscopy;;   TONSILLECTOMY  as child    FAMILY HISTORY: Family History  Problem Relation Age of Onset   Cancer Mother    Diabetes  Mother    Cancer Father     ADVANCED DIRECTIVES (Y/N):  N  HEALTH MAINTENANCE: Social History   Tobacco Use   Smoking status: Never   Smokeless tobacco: Never  Vaping Use   Vaping status: Never Used  Substance Use Topics   Alcohol use: No    Alcohol/week: 0.0 standard drinks of alcohol   Drug use: No     Colonoscopy:  PAP:  Bone density:  Lipid panel:  Allergies  Allergen Reactions   Cymbalta [Duloxetine Hcl] Other (See Comments)    Chest pain   Duloxetine Other (See Comments)   Pregabalin     Other reaction(s): Vertigo, speech problems   Celebrex [Celecoxib] Palpitations   Niacin Hives and Rash    Current Outpatient Medications  Medication Sig Dispense Refill   aspirin  EC 81 MG tablet Take 81 mg by mouth at bedtime.     benzonatate  (TESSALON ) 100 MG capsule Take 2 capsules (200 mg total) by mouth every 8 (eight) hours. (Patient not taking: Reported on 12/20/2023) 21 capsule 0   cyanocobalamin  (VITAMIN B12) 1000 MCG tablet Take 1,000 mcg by mouth daily.     cyclobenzaprine  (FLEXERIL ) 5 MG tablet Take 5 mg by mouth once.     docusate sodium  (COLACE) 100 MG capsule Take 100 mg by mouth at bedtime. Twice a day     ergocalciferol (VITAMIN D2) 1.25 MG (50000 UT) capsule Take 50,000 Units by mouth once a week. Saturday (Patient not taking: Reported on 12/20/2023)     ezetimibe  (ZETIA ) 10 MG tablet Take 10 mg by mouth daily.     FARXIGA 10 MG TABS tablet Take 10 mg by mouth daily. (Patient not taking: Reported on 12/20/2023)     fenofibrate (TRICOR) 145 MG tablet Take 145 mg by mouth daily.     glipiZIDE  (GLUCOTROL  XL) 5 MG 24 hr tablet Take 5 mg by mouth daily.     hydrALAZINE  (APRESOLINE ) 25 MG tablet Take 25 mg by mouth 2 (two) times daily. (Patient not taking: Reported on 01/01/2024)     insulin  glargine, 1 Unit Dial, (TOUJEO SOLOSTAR) 300 UNIT/ML Solostar Pen Inject 30 Units into the skin daily.     ipratropium (ATROVENT ) 0.06 % nasal spray Place 2 sprays into both nostrils  4 (four) times daily. (Patient not taking: Reported on 12/20/2023) 15 mL 12   lisinopril (ZESTRIL) 20 MG tablet Take 20 mg by mouth in the morning and at bedtime.     metFORMIN  (GLUCOPHAGE ) 1000 MG tablet Take 1,000 mg by mouth 2 (two) times daily with a meal.     metoprolol  succinate (TOPROL -XL) 50 MG 24 hr tablet Take 50 mg by mouth daily.     Multiple Vitamins-Minerals (MULTIVITAMIN ADULTS 50+ PO) Take by mouth daily.     OVER THE COUNTER MEDICATION Sciatic Ease, b12     OZEMPIC, 0.25 OR 0.5 MG/DOSE, 2 MG/3ML SOPN 0.25 Units.     promethazine -dextromethorphan (PROMETHAZINE -DM) 6.25-15 MG/5ML syrup Take 5 mLs by mouth 4 (four) times daily as needed. (Patient not taking: Reported on 12/20/2023) 118 mL 0   traMADol  (ULTRAM ) 50 MG tablet Take 50 mg by mouth every 6 (six) hours as needed (pain).      traMADol  (ULTRAM -ER) 300 MG 24 hr tablet Take 300 mg by mouth daily. Pt  takes 300mg  in the morning     Turmeric 500 MG TABS Take by mouth daily.     No current facility-administered medications for this visit.    OBJECTIVE: There were no vitals filed for this visit.    There is no height or weight on file to calculate BMI.    ECOG FS:0 - Asymptomatic  General: Well-developed, well-nourished, no acute distress. HEENT: Normocephalic. Neuro: Alert, answering all questions appropriately. Cranial nerves grossly intact. Psych: Normal affect.  LAB RESULTS:  Lab Results  Component Value Date   NA 137 12/24/2023   K 4.4 12/24/2023   CL 101 12/24/2023   CO2 26 12/24/2023   GLUCOSE 97 12/24/2023   BUN 22 12/24/2023   CREATININE 1.51 (H) 12/24/2023   CALCIUM 9.7 12/24/2023   PROT 6.6 12/19/2023   ALBUMIN 4.4 12/19/2023   AST 26 12/19/2023   ALT 29 12/19/2023   ALKPHOS 39 12/19/2023   BILITOT 0.5 12/19/2023   GFRNONAA 52 (L) 12/24/2023   GFRAA >60 07/12/2012    Lab Results  Component Value Date   WBC 10.6 (H) 01/01/2024   NEUTROABS 3.2 12/19/2023   HGB 15.4 01/01/2024   HCT 45.8  01/01/2024   MCV 84.0 01/01/2024   PLT 224 01/01/2024     STUDIES: No results found.   ASSESSMENT: CLL, Rai stage 0.  PLAN:    CLL, Rai stage 0: Confirmed by peripheral blood flow cytometry.  Patient only has a mildly elevated total white blood cell count ranging between 10.6 and 13.4.  He has no other notable cytopenias.  He had a CT scan of the chest on December 19, 2023 that did not report any lymphadenopathy.  Patient does not require additional imaging or bone marrow biopsy at this time.  Return to clinic in 6 months with repeat laboratory can video-assisted telemedicine visit.  I provided 20 minutes of face-to-face video visit time during this encounter which included chart review, counseling, and coordination of care as documented above.  Patient expressed understanding and was in agreement with this plan. He also understands that He can call clinic at any time with any questions, concerns, or complaints.    Cancer Staging  CLL (chronic lymphocytic leukemia) (HCC) Staging form: Chronic Lymphocytic Leukemia / Small Lymphocytic Lymphoma, AJCC 8th Edition - Clinical stage from 02/01/2024: Modified Rai Stage 0 (Modified Rai risk: Low, Lymphocytosis: Present, Adenopathy: Absent, Organomegaly: Absent, Anemia: Absent, Thrombocytopenia: Absent) - Signed by Shellie Dials, MD on 02/01/2024 Stage prefix: Initial diagnosis   Shellie Dials, MD   02/01/2024 12:57 PM

## 2024-02-22 ENCOUNTER — Telehealth (HOSPITAL_COMMUNITY): Payer: Self-pay | Admitting: *Deleted

## 2024-02-22 NOTE — Telephone Encounter (Signed)
 Reaching out to patient to offer assistance regarding upcoming cardiac imaging study; pt verbalizes understanding of appt date/time, parking situation and where to check in, pre-test NPO status and verified current allergies; name and call back number provided for further questions should they arise  Anthony Copping RN Navigator Cardiac Imaging [facility] [phone] office [phone] cell  Patient to take his daily metoprolol  succinate.

## 2024-02-25 ENCOUNTER — Ambulatory Visit
Admission: RE | Admit: 2024-02-25 | Discharge: 2024-02-25 | Disposition: A | Discharge status: Home / Self Care | Source: Ambulatory Visit | Attending: Internal Medicine | Admitting: Internal Medicine

## 2024-02-25 DIAGNOSIS — I2510 Atherosclerotic heart disease of native coronary artery without angina pectoris: Secondary | ICD-10-CM | POA: Insufficient documentation

## 2024-02-25 DIAGNOSIS — R0789 Other chest pain: Secondary | ICD-10-CM | POA: Insufficient documentation

## 2024-02-25 DIAGNOSIS — R0602 Shortness of breath: Secondary | ICD-10-CM | POA: Diagnosis present

## 2024-02-25 LAB — POCT I-STAT CREATININE: Creatinine, Ser: 1.3 mg/dL — ABNORMAL HIGH (ref 0.61–1.24)

## 2024-02-25 MED ORDER — IOHEXOL 350 MG/ML SOLN
80.0000 mL | Freq: Once | INTRAVENOUS | Status: AC | PRN
  Administered 2024-02-25: 80 mL via INTRAVENOUS

## 2024-02-25 MED ORDER — NITROGLYCERIN 0.4 MG SL SUBL
0.8000 mg | SUBLINGUAL_TABLET | Freq: Once | SUBLINGUAL | Status: AC
  Administered 2024-02-25: 0.8 mg via SUBLINGUAL

## 2024-02-25 MED ORDER — METOPROLOL TARTRATE 5 MG/5ML IV SOLN
10.0000 mg | Freq: Once | INTRAVENOUS | Status: DC | PRN
  Filled 2024-02-25: qty 10

## 2024-02-25 MED ORDER — DILTIAZEM HCL 25 MG/5ML IV SOLN: 10.0000 mg | INTRAVENOUS | Status: DC | PRN

## 2024-02-25 NOTE — Progress Notes (Signed)
Patient tolerated procedure well. W/C to lobby.  Ambulate w/o difficulty. Denies light headedness or being dizzy. Encouraged to drink extra water today and reasoning explained. Verbalized understanding. All questions answered. ABC intact. No further needs. Discharge from procedure area w/o issues.

## 2024-06-13 ENCOUNTER — Ambulatory Visit (INDEPENDENT_AMBULATORY_CARE_PROVIDER_SITE_OTHER): Admitting: Internal Medicine

## 2024-06-13 ENCOUNTER — Encounter: Payer: Self-pay | Admitting: Internal Medicine

## 2024-06-13 ENCOUNTER — Ambulatory Visit: Payer: Self-pay | Admitting: Internal Medicine

## 2024-06-13 VITALS — BP 150/74 | HR 73 | Ht 72.0 in | Wt 251.0 lb

## 2024-06-13 DIAGNOSIS — E119 Type 2 diabetes mellitus without complications: Secondary | ICD-10-CM | POA: Insufficient documentation

## 2024-06-13 DIAGNOSIS — E1159 Type 2 diabetes mellitus with other circulatory complications: Secondary | ICD-10-CM | POA: Insufficient documentation

## 2024-06-13 DIAGNOSIS — E1169 Type 2 diabetes mellitus with other specified complication: Secondary | ICD-10-CM

## 2024-06-13 DIAGNOSIS — E782 Mixed hyperlipidemia: Secondary | ICD-10-CM

## 2024-06-13 DIAGNOSIS — C9110 Chronic lymphocytic leukemia of B-cell type not having achieved remission: Secondary | ICD-10-CM

## 2024-06-13 DIAGNOSIS — E559 Vitamin D deficiency, unspecified: Secondary | ICD-10-CM

## 2024-06-13 DIAGNOSIS — E039 Hypothyroidism, unspecified: Secondary | ICD-10-CM | POA: Diagnosis not present

## 2024-06-13 DIAGNOSIS — E538 Deficiency of other specified B group vitamins: Secondary | ICD-10-CM

## 2024-06-13 DIAGNOSIS — Z794 Long term (current) use of insulin: Secondary | ICD-10-CM

## 2024-06-13 DIAGNOSIS — I152 Hypertension secondary to endocrine disorders: Secondary | ICD-10-CM

## 2024-06-13 LAB — POC CREATINE & ALBUMIN,URINE
Albumin/Creatinine Ratio, Urine, POC: <30
Creatinine, POC: 100 mg/dL
Microalbumin Ur, POC: 10 mg/L

## 2024-06-13 LAB — POCT CBG (FASTING - GLUCOSE)-MANUAL ENTRY: Glucose Fasting, POC: 249 mg/dL — AB (ref 70–99)

## 2024-06-13 MED ORDER — SEMAGLUTIDE(0.25 OR 0.5MG/DOS) 2 MG/3ML ~~LOC~~ SOPN: 0.5000 mg | PEN_INJECTOR | SUBCUTANEOUS | 1 refills | Status: DC

## 2024-06-13 MED ORDER — LISINOPRIL-HYDROCHLOROTHIAZIDE 20-25 MG PO TABS
1.0000 | ORAL_TABLET | Freq: Every day | ORAL | 3 refills | Status: DC
Start: 2024-06-13 — End: 2024-08-28

## 2024-06-13 MED ORDER — LISINOPRIL 20 MG PO TABS
20.0000 mg | ORAL_TABLET | Freq: Every day | ORAL | 3 refills | Status: DC
Start: 2024-06-13 — End: 2024-08-28

## 2024-06-13 MED ORDER — HYDRALAZINE HCL 50 MG PO TABS
50.0000 mg | ORAL_TABLET | Freq: Two times a day (BID) | ORAL | 3 refills | Status: DC
Start: 2024-06-13 — End: 2024-08-28

## 2024-06-13 NOTE — Progress Notes (Signed)
 New Patient Office Visit  Subjective   Patient ID: Anthony Harvey, male    DOB: 1961/09/15  Age: 63 y.o. MRN: [id number]  CC:  Chief Complaint  Patient presents with   Establish Care    NPE    HPI Anthony Harvey presents to establish care Previous Primary Care provider/office:   he does have additional concerns to discuss today.   Patient is here to establish care as a new patient. He reports he is feeling well but is concerned about his frequent elevated BP at home. BP also elevate today in the office. Will increase his Hydralazine  to 50 mg twice a day. Encouraged patient to start taking Zestril  20 mg at bedtime and the Zestril  20 mg/ HTCZ 25 mg in the morning to reduce nocturia. Patient denies headache, blurred vision, chest pain, shortness of breath.  Patient sees Oncology for possible CLL; he has an appointment with them in Oct 2025 to repeat blood work. He has history of night sweats, abnormal WBC count, and fatigue which is why he was previously referred to Oncology.  He is up to date on his routine screenings. He had his last colonoscopy in 2024. He reports recent DM eye exam that was normal. He had sleep study in 2012 that did not show sleep apnea; patient reports significant weight loss since then. He had a positive screening on epsworth sleepiness scale. Will discuss future sleep study to determine if has OSA at next appointment.  Patient is diabetic and currently taking 30 units of insulin  glargine once daily as well as Glipizide  5 mg, Metformin  1000 mg BID, and Farxiga 10 mg. He was previously on 0.25 mg of Ozempic ; Will increase dose to 0.5 mg once weekly to improve glucose control. Last HbgA1c was 7.5% in 3/25.  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 Has seen Neurology in the past about this issue.    Outpatient Encounter Medications as of 06/13/2024  Medication Sig   amLODipine (NORVASC) 5 MG tablet Take 5 mg by mouth daily.    aspirin  EC 81 MG tablet Take 81 mg by mouth at bedtime.   cyclobenzaprine  (FLEXERIL ) 5 MG tablet Take 5 mg by mouth once.   docusate sodium  (COLACE) 100 MG capsule Take 100 mg by mouth at bedtime. Twice a day   ezetimibe  (ZETIA ) 10 MG tablet Take 10 mg by mouth daily.   fenofibrate (TRICOR) 145 MG tablet Take 145 mg by mouth daily.   glipiZIDE  (GLUCOTROL  XL) 5 MG 24 hr tablet Take 5 mg by mouth daily.   insulin  glargine, 1 Unit Dial, (TOUJEO SOLOSTAR) 300 UNIT/ML Solostar Pen Inject 30 Units into the skin daily.   metFORMIN  (GLUCOPHAGE ) 1000 MG tablet Take 1,000 mg by mouth 2 (two) times daily with a meal.   metoprolol  succinate (TOPROL -XL) 50 MG 24 hr tablet Take 50 mg by mouth daily.   Multiple Vitamins-Minerals (MULTIVITAMIN ADULTS 50+ PO) Take by mouth daily.   OVER THE COUNTER MEDICATION Sciatic Ease, b12   Semaglutide ,0.25 or 0.5MG /DOS, 2 MG/3ML SOPN Inject 0.5 mg into the skin once a week.   traMADol  (ULTRAM ) 50 MG tablet Take 50 mg by mouth every 6 (six) hours as needed (pain).  (Patient taking differently: Take 50 mg by mouth every 12 (twelve) hours as needed (pain).)   traMADol  (ULTRAM -ER) 300 MG 24 hr tablet Take 300 mg by mouth daily. Pt takes 300mg  in the morning   Turmeric 500 MG TABS Take by  mouth daily.   [DISCONTINUED] hydrALAZINE  (APRESOLINE ) 25 MG tablet Take 50 mg by mouth 2 (two) times daily.   [DISCONTINUED] lisinopril  (ZESTRIL ) 20 MG tablet Take 20 mg by mouth daily. just at bedtime.   [DISCONTINUED] lisinopril -hydrochlorothiazide  (ZESTORETIC ) 20-25 MG tablet Take 1 tablet by mouth daily. In the morning.   [DISCONTINUED] OZEMPIC , 0.25 OR 0.5 MG/DOSE, 2 MG/3ML SOPN Inject 0.5 Units as directed once a week.   benzonatate  (TESSALON ) 100 MG capsule Take 2 capsules (200 mg total) by mouth every 8 (eight) hours. (Patient not taking: Reported on 06/13/2024)   cyanocobalamin  (VITAMIN B12) 1000 MCG tablet Take 1,000 mcg by mouth daily. (Patient not taking: Reported on 06/13/2024)    ergocalciferol (VITAMIN D2) 1.25 MG (50000 UT) capsule Take 50,000 Units by mouth once a week. Saturday (Patient not taking: Reported on 06/13/2024)   FARXIGA 10 MG TABS tablet Take 10 mg by mouth daily. (Patient not taking: Reported on 06/13/2024)   hydrALAZINE  (APRESOLINE ) 50 MG tablet Take 1 tablet (50 mg total) by mouth 2 (two) times daily.   ipratropium (ATROVENT ) 0.06 % nasal spray Place 2 sprays into both nostrils 4 (four) times daily. (Patient not taking: Reported on 06/13/2024)   lisinopril  (ZESTRIL ) 20 MG tablet Take 1 tablet (20 mg total) by mouth daily. just at bedtime.   lisinopril -hydrochlorothiazide  (ZESTORETIC ) 20-25 MG tablet Take 1 tablet by mouth daily. In the morning.   promethazine -dextromethorphan (PROMETHAZINE -DM) 6.25-15 MG/5ML syrup Take 5 mLs by mouth 4 (four) times daily as needed. (Patient not taking: Reported on 06/13/2024)   rosuvastatin (CRESTOR) 5 MG tablet Take 5 mg by mouth daily.   No facility-administered encounter medications on file as of 06/13/2024.    Past Medical History:  Diagnosis Date   Arthritis    Diabetes mellitus without complication (HCC)    GERD (gastroesophageal reflux disease)    Hx of back injury 2010   fell from piping and hit back   Hyperlipidemia    Hypertension    Iron deficiency anemia    Sciatica of left side    Vertigo    rare    Past Surgical History:  Procedure Laterality Date   COLONOSCOPY WITH PROPOFOL  N/A 09/10/2023   Procedure: COLONOSCOPY WITH PROPOFOL ;  Surgeon: Jinny Carmine, MD;  Location: [facility];  Service: Endoscopy;  Laterality: N/A;   ESOPHAGOGASTRODUODENOSCOPY (EGD) WITH PROPOFOL  N/A 09/10/2023   Procedure: ESOPHAGOGASTRODUODENOSCOPY (EGD) WITH PROPOFOL ;  Surgeon: Jinny Carmine, MD;  Location: [facility];  Service: Endoscopy;  Laterality: N/A;  Diabetic   GIVENS CAPSULE STUDY N/A 09/26/2023   Procedure: GIVENS CAPSULE STUDY;  Surgeon: Jinny Carmine, MD;  Location: [facility];  Service:  Endoscopy;  Laterality: N/A;   HERNIA REPAIR  08/03/2008   Right direct inguinal hernia, large Ultra Pro mesh.   KNEE ARTHROSCOPY Bilateral    KNEE ARTHROSCOPY Right 11/16/2016   Procedure: ARTHROSCOPY KNEE, lateral release, partial meniscectomy;  Surgeon: Ozell Flake, MD;  Location: [facility];  Service: Orthopedics;  Laterality: Right;   POLYPECTOMY  09/10/2023   Procedure: POLYPECTOMY;  Surgeon: Jinny Carmine, MD;  Location: [facility];  Service: Endoscopy;;   TONSILLECTOMY  as child    Family History  Problem Relation Age of Onset   Cancer Mother    Diabetes Mother    Cancer Father     Social History   Socioeconomic History   Marital status: Married    Spouse name: Not on file   Number of children: Not on file   Years of  education: Not on file   Highest education level: Not on file  Occupational History   Not on file  Tobacco Use   Smoking status: Never   Smokeless tobacco: Never  Vaping Use   Vaping status: Never Used  Substance and Sexual Activity   Alcohol use: No    Alcohol/week: 0.0 standard drinks of alcohol   Drug use: No   Sexual activity: Not on file  Other Topics Concern   Not on file  Social History Narrative   Not on file   Social Drivers of Health   Financial Resource Strain: Low Risk  (05/06/2024)   [facility]   Overall Financial Resource Strain (CARDIA)    How hard is it for you to pay for the very basics like food, housing, medical care, and heating?: Not hard at all  Food Insecurity: No Food Insecurity (05/06/2024)   Received from [facility]   Hunger Vital Sign    Within the past 12 months, you worried that your food would run out before you got the money to buy more.: Never true    Within the past 12 months, the food you bought just didn't last and you didn't have money to get more.: Never true  Transportation Needs: No Transportation Needs (05/06/2024)   Received from [facility] - Transportation    In  the past 12 months, has lack of transportation kept you from medical appointments or from getting medications?: No    In the past 12 months, has lack of transportation kept you from meetings, work, or from getting things needed for daily living?: No  Physical Activity: Not on file  Stress: Not on file  Social Connections: Not on file  Intimate Partner Violence: Not At Risk (01/01/2024)   Humiliation, Afraid, Rape, and Kick questionnaire    Fear of Current or Ex-Partner: No    Emotionally Abused: No    Physically Abused: No    Sexually Abused: No    Review of Systems  Constitutional:  Positive for diaphoresis and malaise/fatigue.  HENT: Negative.    Eyes: Negative.   Respiratory: Negative.  Negative for cough and shortness of breath.   Cardiovascular: Negative.  Negative for chest pain, palpitations and leg swelling.  Gastrointestinal: Negative.  Negative for abdominal pain, blood in stool, constipation, diarrhea, heartburn, melena, nausea and vomiting.  Genitourinary: Negative.  Negative for dysuria and flank pain.  Musculoskeletal:  Positive for back pain. Negative for joint pain and myalgias.  Skin: Negative.   Neurological:  Positive for tingling (BLE). Negative for dizziness and headaches.  Endo/Heme/Allergies: Negative.   Psychiatric/Behavioral: Negative.  Negative for depression and suicidal ideas. The patient is not nervous/anxious.         Objective   BP (!) 150/74   Pulse 73   Ht 6' (1.829 m)   Wt 251 lb (113.9 kg)   SpO2 95%   BMI 34.04 kg/m   Physical Exam Vitals and nursing note reviewed.  Constitutional:      Appearance: Normal appearance.  HENT:     Head: Normocephalic and atraumatic.     Nose: Nose normal.     Mouth/Throat:     Mouth: Mucous membranes are moist.     Pharynx: Oropharynx is clear.  Eyes:     Conjunctiva/sclera: Conjunctivae normal.     Pupils: Pupils are equal, round, and reactive to light.  Cardiovascular:     Rate and Rhythm:  Normal rate and regular rhythm.  Pulses: Normal pulses.     Heart sounds: Normal heart sounds.  Pulmonary:     Effort: Pulmonary effort is normal.     Breath sounds: Normal breath sounds.  Abdominal:     General: Bowel sounds are normal. There is no distension.     Palpations: Abdomen is soft. There is no mass.     Tenderness: There is no right CVA tenderness, left CVA tenderness or rebound.     Hernia: No hernia is present.  Musculoskeletal:        General: Normal range of motion.     Cervical back: Normal range of motion.  Skin:    General: Skin is warm and dry.  Neurological:     General: No focal deficit present.     Mental Status: He is alert and oriented to person, place, and time.  Psychiatric:        Mood and Affect: Mood normal.        Behavior: Behavior normal.        Judgment: Judgment normal.        Assessment & Plan:   Start taking 0.5 mg Ozempic  once weekly injections. Increase Hydralazine  to 50 mg twice a day. Continue other medications as prescribed. Will collect routine labs and review results with patient. Problem List Items Addressed This Visit     CLL (chronic lymphocytic leukemia) (HCC)   Type 2 diabetes mellitus without complication, with long-term current use of insulin  (HCC) - Primary   Relevant Medications   rosuvastatin (CRESTOR) 5 MG tablet   Semaglutide ,0.25 or 0.5MG /DOS, 2 MG/3ML SOPN   lisinopril  (ZESTRIL ) 20 MG tablet   lisinopril -hydrochlorothiazide  (ZESTORETIC ) 20-25 MG tablet   Other Relevant Orders   POCT CBG (Fasting - Glucose) (Completed)   Hemoglobin A1c   Microalbumin / Creatinine Urine Ratio   POC CREATINE & ALBUMIN,URINE (Completed)   Combined hyperlipidemia associated with type 2 diabetes mellitus (HCC)   Relevant Medications   amLODipine (NORVASC) 5 MG tablet   rosuvastatin (CRESTOR) 5 MG tablet   Semaglutide ,0.25 or 0.5MG /DOS, 2 MG/3ML SOPN   lisinopril  (ZESTRIL ) 20 MG tablet   lisinopril -hydrochlorothiazide  (ZESTORETIC )  20-25 MG tablet   hydrALAZINE  (APRESOLINE ) 50 MG tablet   Other Relevant Orders   Lipid Panel w/o Chol/HDL Ratio   Hypertension associated with diabetes (HCC)   Relevant Medications   amLODipine (NORVASC) 5 MG tablet   rosuvastatin (CRESTOR) 5 MG tablet   Semaglutide ,0.25 or 0.5MG /DOS, 2 MG/3ML SOPN   lisinopril  (ZESTRIL ) 20 MG tablet   lisinopril -hydrochlorothiazide  (ZESTORETIC ) 20-25 MG tablet   hydrALAZINE  (APRESOLINE ) 50 MG tablet   Other Relevant Orders   CBC with Diff   CMP14+EGFR   Vitamin D deficiency   Relevant Orders   Vitamin D (25 hydroxy)   Hypothyroidism   Relevant Orders   TSH+T4F+T3Free   B12 deficiency   Relevant Orders   Vitamin B12    Return in about 2 weeks (around 06/27/2024).   Total time spent: 30 minutes  Anthony FREDY RAMAN, MD  06/13/2024   This document may have been prepared by [facility] Voice Recognition software and as such may include unintentional dictation errors.

## 2024-06-18 ENCOUNTER — Other Ambulatory Visit: Payer: Self-pay | Admitting: Internal Medicine

## 2024-06-18 ENCOUNTER — Other Ambulatory Visit

## 2024-06-19 ENCOUNTER — Ambulatory Visit: Payer: Self-pay | Admitting: Internal Medicine

## 2024-06-19 LAB — CBC WITH DIFFERENTIAL/PLATELET
Basophils Absolute: 0 x10E3/uL (ref 0.0–0.2)
Basos: 0 %
EOS (ABSOLUTE): 0.2 x10E3/uL (ref 0.0–0.4)
Eos: 1 %
Hematocrit: 47.4 % (ref 37.5–51.0)
Hemoglobin: 14.9 g/dL (ref 13.0–17.7)
Immature Grans (Abs): 0 x10E3/uL (ref 0.0–0.1)
Immature Granulocytes: 0 %
Lymphocytes Absolute: 8.6 x10E3/uL — ABNORMAL HIGH (ref 0.7–3.1)
Lymphs: 67 %
MCH: 28.5 pg (ref 26.6–33.0)
MCHC: 31.4 g/dL — ABNORMAL LOW (ref 31.5–35.7)
MCV: 91 fL (ref 79–97)
Monocytes Absolute: 0.5 x10E3/uL (ref 0.1–0.9)
Monocytes: 4 %
Neutrophils Absolute: 3.7 x10E3/uL (ref 1.4–7.0)
Neutrophils: 28 %
Platelets: 209 x10E3/uL (ref 150–450)
RBC: 5.22 x10E6/uL (ref 4.14–5.80)
RDW: 13.4 % (ref 11.6–15.4)
WBC: 13.1 x10E3/uL — ABNORMAL HIGH (ref 3.4–10.8)

## 2024-06-19 LAB — TSH+T4F+T3FREE
Free T4: 1.43 ng/dL (ref 0.82–1.77)
T3, Free: 3.5 pg/mL (ref 2.0–4.4)
TSH: 2.2 u[IU]/mL (ref 0.450–4.500)

## 2024-06-19 LAB — LIPID PANEL W/O CHOL/HDL RATIO
Cholesterol, Total: 132 mg/dL (ref 100–199)
HDL: 32 mg/dL — ABNORMAL LOW (ref >39)
LDL Chol Calc (NIH): 42 mg/dL (ref 0–99)
Triglycerides: 397 mg/dL — ABNORMAL HIGH (ref 0–149)
VLDL Cholesterol Cal: 58 mg/dL — ABNORMAL HIGH (ref 5–40)

## 2024-06-19 LAB — MICROALBUMIN / CREATININE URINE RATIO
Creatinine, Urine: 113.8 mg/dL
Microalb/Creat Ratio: 5 mg/g{creat} (ref 0–29)
Microalbumin, Urine: 6.1 ug/mL

## 2024-06-19 LAB — CMP14+EGFR
ALT: 32 IU/L (ref 0–44)
AST: 25 IU/L (ref 0–40)
Albumin: 4.9 g/dL (ref 3.9–4.9)
Alkaline Phosphatase: 57 IU/L (ref 44–121)
BUN/Creatinine Ratio: 12 (ref 10–24)
BUN: 17 mg/dL (ref 8–27)
Bilirubin Total: 0.3 mg/dL (ref 0.0–1.2)
CO2: 24 mmol/L (ref 20–29)
Calcium: 11 mg/dL — ABNORMAL HIGH (ref 8.6–10.2)
Chloride: 96 mmol/L (ref 96–106)
Creatinine, Ser: 1.38 mg/dL — ABNORMAL HIGH (ref 0.76–1.27)
Globulin, Total: 1.9 g/dL (ref 1.5–4.5)
Glucose: 142 mg/dL — ABNORMAL HIGH (ref 70–99)
Potassium: 4.1 mmol/L (ref 3.5–5.2)
Sodium: 139 mmol/L (ref 134–144)
Total Protein: 6.8 g/dL (ref 6.0–8.5)
eGFR: 57 mL/min/1.73 — ABNORMAL LOW (ref >59)

## 2024-06-19 LAB — HEMOGLOBIN A1C
Est. average glucose Bld gHb Est-mCnc: 177 mg/dL
Hgb A1c MFr Bld: 7.8 % — ABNORMAL HIGH (ref 4.8–5.6)

## 2024-06-19 LAB — VITAMIN B12: Vitamin B-12: 824 pg/mL (ref 232–1245)

## 2024-06-19 LAB — VITAMIN D 25 HYDROXY (VIT D DEFICIENCY, FRACTURES): Vit D, 25-Hydroxy: 46.4 ng/mL (ref 30.0–100.0)

## 2024-06-27 ENCOUNTER — Encounter: Payer: Self-pay | Admitting: Internal Medicine

## 2024-06-27 ENCOUNTER — Ambulatory Visit (INDEPENDENT_AMBULATORY_CARE_PROVIDER_SITE_OTHER): Admitting: Internal Medicine

## 2024-06-27 VITALS — BP 136/70 | HR 74 | Ht 72.0 in | Wt 247.4 lb

## 2024-06-27 DIAGNOSIS — E559 Vitamin D deficiency, unspecified: Secondary | ICD-10-CM

## 2024-06-27 DIAGNOSIS — I152 Hypertension secondary to endocrine disorders: Secondary | ICD-10-CM

## 2024-06-27 DIAGNOSIS — E039 Hypothyroidism, unspecified: Secondary | ICD-10-CM | POA: Diagnosis not present

## 2024-06-27 DIAGNOSIS — E1159 Type 2 diabetes mellitus with other circulatory complications: Secondary | ICD-10-CM

## 2024-06-27 DIAGNOSIS — C9110 Chronic lymphocytic leukemia of B-cell type not having achieved remission: Secondary | ICD-10-CM | POA: Diagnosis not present

## 2024-06-27 DIAGNOSIS — Z794 Long term (current) use of insulin: Secondary | ICD-10-CM

## 2024-06-27 DIAGNOSIS — E782 Mixed hyperlipidemia: Secondary | ICD-10-CM

## 2024-06-27 DIAGNOSIS — E1169 Type 2 diabetes mellitus with other specified complication: Secondary | ICD-10-CM | POA: Diagnosis not present

## 2024-06-27 MED ORDER — FARXIGA 10 MG PO TABS: 10.0000 mg | ORAL_TABLET | Freq: Every day | ORAL | 3 refills | Status: DC

## 2024-06-27 NOTE — Progress Notes (Signed)
 Established Patient Office Visit  Subjective:  Patient ID: Anthony Harvey, male    DOB: 10-29-1960  Age: 63 y.o. MRN: [id number]  Chief Complaint  Patient presents with   Follow-up    2 week follow up     Patient comes in for his follow up. His Ozempic  dose was increased to 0.5 mg /week, caused nausea but also lost weight. BP looks better since Hydralazine  dose increased to 50 mg bid. More comfortable at night since diuretic switched to morning. Labs discussed, Hgba1c is 7.8.Will add Farxiga . Triglycerides are up ,despite being on Tricor- needs very strict diet control. Hypoglycemia rarely at night which causes rebound hyperglycemia fasting in am; reduce insulin  if becomes more frequent.    No other concerns at this time.   Past Medical History:  Diagnosis Date   Arthritis    Diabetes mellitus without complication (HCC)    GERD (gastroesophageal reflux disease)    Hx of back injury 2010   fell from piping and hit back   Hyperlipidemia    Hypertension    Iron deficiency anemia    Sciatica of left side    Vertigo    rare    Past Surgical History:  Procedure Laterality Date   COLONOSCOPY WITH PROPOFOL  N/A 09/10/2023   Procedure: COLONOSCOPY WITH PROPOFOL ;  Surgeon: Jinny Carmine, MD;  Location: [facility];  Service: Endoscopy;  Laterality: N/A;   ESOPHAGOGASTRODUODENOSCOPY (EGD) WITH PROPOFOL  N/A 09/10/2023   Procedure: ESOPHAGOGASTRODUODENOSCOPY (EGD) WITH PROPOFOL ;  Surgeon: Jinny Carmine, MD;  Location: [facility];  Service: Endoscopy;  Laterality: N/A;  Diabetic   GIVENS CAPSULE STUDY N/A 09/26/2023   Procedure: GIVENS CAPSULE STUDY;  Surgeon: Jinny Carmine, MD;  Location: [facility];  Service: Endoscopy;  Laterality: N/A;   HERNIA REPAIR  08/03/2008   Right direct inguinal hernia, large Ultra Pro mesh.   KNEE ARTHROSCOPY Bilateral    KNEE ARTHROSCOPY Right 11/16/2016   Procedure: ARTHROSCOPY KNEE, lateral release, partial meniscectomy;  Surgeon:  Ozell Flake, MD;  Location: [facility];  Service: Orthopedics;  Laterality: Right;   POLYPECTOMY  09/10/2023   Procedure: POLYPECTOMY;  Surgeon: Jinny Carmine, MD;  Location: [facility];  Service: Endoscopy;;   TONSILLECTOMY  as child    Social History   Socioeconomic History   Marital status: Married    Spouse name: Not on file   Number of children: Not on file   Years of education: Not on file   Highest education level: Not on file  Occupational History   Not on file  Tobacco Use   Smoking status: Never   Smokeless tobacco: Never  Vaping Use   Vaping status: Never Used  Substance and Sexual Activity   Alcohol use: No    Alcohol/week: 0.0 standard drinks of alcohol   Drug use: No   Sexual activity: Not on file  Other Topics Concern   Not on file  Social History Narrative   Not on file   Social Drivers of Health   Financial Resource Strain: Low Risk  (05/06/2024)   Received from [facility]   Overall Financial Resource Strain (CARDIA)    How hard is it for you to pay for the very basics like food, housing, medical care, and heating?: Not hard at all  Food Insecurity: No Food Insecurity (05/06/2024)   Received from [facility]   Hunger Vital Sign    Within the past 12 months, you worried that your food would run out before you got  the money to buy more.: Never true    Within the past 12 months, the food you bought just didn't last and you didn't have money to get more.: Never true  Transportation Needs: No Transportation Needs (05/06/2024)   Received from [facility]    In the past 12 months, has lack of transportation kept you from medical appointments or from getting medications?: No    In the past 12 months, has lack of transportation kept you from meetings, work, or from getting things needed for daily living?: No  Physical Activity: Not on file  Stress: Not on file  Social Connections: Not on file  Intimate Partner Violence:  Not At Risk (01/01/2024)   Humiliation, Afraid, Rape, and Kick questionnaire    Fear of Current or Ex-Partner: No    Emotionally Abused: No    Physically Abused: No    Sexually Abused: No    Family History  Problem Relation Age of Onset   Cancer Mother    Diabetes Mother    Cancer Father     Allergies  Allergen Reactions   Cymbalta [Duloxetine Hcl] Other (See Comments)    Chest pain   Duloxetine Other (See Comments)   Pregabalin     Other reaction(s): Vertigo, speech problems   Celebrex [Celecoxib] Palpitations   Niacin Hives and Rash    Outpatient Medications Prior to Visit  Medication Sig   amLODipine (NORVASC) 5 MG tablet Take 5 mg by mouth daily.   aspirin  EC 81 MG tablet Take 81 mg by mouth at bedtime.   cyclobenzaprine  (FLEXERIL ) 5 MG tablet Take 5 mg by mouth once.   docusate sodium  (COLACE) 100 MG capsule Take 100 mg by mouth at bedtime. Twice a day   ezetimibe  (ZETIA ) 10 MG tablet Take 10 mg by mouth daily.   fenofibrate (TRICOR) 145 MG tablet Take 145 mg by mouth daily.   glipiZIDE  (GLUCOTROL  XL) 5 MG 24 hr tablet Take 5 mg by mouth daily.   hydrALAZINE  (APRESOLINE ) 50 MG tablet Take 1 tablet (50 mg total) by mouth 2 (two) times daily.   insulin  glargine, 1 Unit Dial, (TOUJEO SOLOSTAR) 300 UNIT/ML Solostar Pen Inject 30 Units into the skin daily.   lisinopril  (ZESTRIL ) 20 MG tablet Take 1 tablet (20 mg total) by mouth daily. just at bedtime.   lisinopril -hydrochlorothiazide  (ZESTORETIC ) 20-25 MG tablet Take 1 tablet by mouth daily. In the morning.   metFORMIN  (GLUCOPHAGE ) 1000 MG tablet Take 1,000 mg by mouth 2 (two) times daily with a meal.   metoprolol  succinate (TOPROL -XL) 50 MG 24 hr tablet Take 50 mg by mouth daily.   Multiple Vitamins-Minerals (MULTIVITAMIN ADULTS 50+ PO) Take by mouth daily.   OVER THE COUNTER MEDICATION Sciatic Ease, b12   rosuvastatin (CRESTOR) 5 MG tablet Take 5 mg by mouth daily.   Semaglutide ,0.25 or 0.5MG /DOS, 2 MG/3ML SOPN Inject  0.5 mg into the skin once a week.   traMADol  (ULTRAM ) 50 MG tablet Take 50 mg by mouth every 6 (six) hours as needed (pain).  (Patient taking differently: Take 50 mg by mouth every 12 (twelve) hours as needed (pain).)   traMADol  (ULTRAM -ER) 300 MG 24 hr tablet Take 300 mg by mouth daily. Pt takes 300mg  in the morning   Turmeric 500 MG TABS Take by mouth daily.   cyanocobalamin  (VITAMIN B12) 1000 MCG tablet Take 1,000 mcg by mouth daily. (Patient not taking: Reported on 06/27/2024)   ergocalciferol  (VITAMIN D2) 1.25 MG (50000  UT) capsule Take 50,000 Units by mouth once a week. Saturday (Patient not taking: Reported on 06/27/2024)   [DISCONTINUED] benzonatate  (TESSALON ) 100 MG capsule Take 2 capsules (200 mg total) by mouth every 8 (eight) hours. (Patient not taking: Reported on 06/27/2024)   [DISCONTINUED] FARXIGA  10 MG TABS tablet Take 10 mg by mouth daily. (Patient not taking: Reported on 06/27/2024)   [DISCONTINUED] ipratropium (ATROVENT ) 0.06 % nasal spray Place 2 sprays into both nostrils 4 (four) times daily. (Patient not taking: Reported on 06/27/2024)   [DISCONTINUED] promethazine -dextromethorphan (PROMETHAZINE -DM) 6.25-15 MG/5ML syrup Take 5 mLs by mouth 4 (four) times daily as needed. (Patient not taking: Reported on 06/27/2024)   No facility-administered medications prior to visit.    Review of Systems  Constitutional: Negative.  Negative for chills, fever and malaise/fatigue.  HENT: Negative.  Negative for congestion and sore throat.   Eyes: Negative.  Negative for blurred vision and pain.  Respiratory: Negative.  Negative for cough and shortness of breath.   Cardiovascular: Negative.  Negative for chest pain, palpitations and leg swelling.  Gastrointestinal: Negative.  Negative for abdominal pain, blood in stool, constipation, diarrhea, heartburn, melena, nausea and vomiting.  Genitourinary: Negative.  Negative for dysuria, flank pain, frequency and urgency.  Musculoskeletal: Negative.   Negative for joint pain and myalgias.  Skin: Negative.   Neurological: Negative.  Negative for dizziness, tingling, sensory change, weakness and headaches.  Endo/Heme/Allergies: Negative.   Psychiatric/Behavioral: Negative.  Negative for depression and suicidal ideas. The patient is not nervous/anxious.        Objective:   BP 136/70   Pulse 74   Ht 6' (1.829 m)   Wt 247 lb 6.4 oz (112.2 kg)   SpO2 98%   BMI 33.55 kg/m   Vitals:   06/27/24 1111  BP: 136/70  Pulse: 74  Height: 6' (1.829 m)  Weight: 247 lb 6.4 oz (112.2 kg)  SpO2: 98%  BMI (Calculated): 33.55    Physical Exam Vitals and nursing note reviewed.  Constitutional:      General: He is not in acute distress.    Appearance: Normal appearance. He is not ill-appearing.  HENT:     Head: Normocephalic and atraumatic.     Nose: Nose normal.     Mouth/Throat:     Mouth: Mucous membranes are moist.     Pharynx: Oropharynx is clear.  Eyes:     Conjunctiva/sclera: Conjunctivae normal.     Pupils: Pupils are equal, round, and reactive to light.  Cardiovascular:     Rate and Rhythm: Normal rate and regular rhythm.     Pulses: Normal pulses.     Heart sounds: Normal heart sounds.  Pulmonary:     Effort: Pulmonary effort is normal.     Breath sounds: Normal breath sounds. No wheezing or rhonchi.  Abdominal:     General: Bowel sounds are normal. There is no distension.     Palpations: Abdomen is soft.     Tenderness: There is no abdominal tenderness.  Musculoskeletal:        General: Normal range of motion.     Cervical back: Normal range of motion and neck supple.     Right lower leg: No edema.     Left lower leg: No edema.  Skin:    General: Skin is warm and dry.     Capillary Refill: Capillary refill takes less than 2 seconds.  Neurological:     General: No focal deficit present.     Mental Status:  He is alert and oriented to person, place, and time.     Sensory: No sensory deficit.     Motor: No weakness.   Psychiatric:        Mood and Affect: Mood normal.        Behavior: Behavior normal.        Judgment: Judgment normal.      No results found for any visits on 06/27/24.  Recent Results (from the past 2160 hours)  POCT CBG (Fasting - Glucose)     Status: Abnormal   Collection Time: 06/13/24  1:11 PM  Result Value Ref Range   Glucose Fasting, POC 249 (A) 70 - 99 mg/dL  POC CREATINE & ALBUMIN,URINE     Status: Normal   Collection Time: 06/13/24  1:49 PM  Result Value Ref Range   Microalbumin Ur, POC 10 mg/L   Creatinine, POC 100 mg/dL   Albumin/Creatinine Ratio, Urine, POC <30   CBC with Diff     Status: Abnormal   Collection Time: 06/18/24 10:15 AM  Result Value Ref Range   WBC 13.1 (H) 3.4 - 10.8 x10E3/uL   RBC 5.22 4.14 - 5.80 x10E6/uL    Comment: Polychromasia present   Hemoglobin 14.9 13.0 - 17.7 g/dL   Hematocrit 52.5 62.4 - 51.0 %   MCV 91 79 - 97 fL   MCH 28.5 26.6 - 33.0 pg   MCHC 31.4 (L) 31.5 - 35.7 g/dL   RDW 86.5 88.3 - 84.5 %   Platelets 209 150 - 450 x10E3/uL   Neutrophils 28 Not Estab. %   Lymphs 67 Not Estab. %    Comment: Atypical lymphocytes. Smudge cells present    Monocytes 4 Not Estab. %   Eos 1 Not Estab. %   Basos 0 Not Estab. %   Neutrophils Absolute 3.7 1.4 - 7.0 x10E3/uL   Lymphocytes Absolute 8.6 (H) 0.7 - 3.1 x10E3/uL   Monocytes Absolute 0.5 0.1 - 0.9 x10E3/uL   EOS (ABSOLUTE) 0.2 0.0 - 0.4 x10E3/uL   Basophils Absolute 0.0 0.0 - 0.2 x10E3/uL   Immature Granulocytes 0 Not Estab. %   Immature Grans (Abs) 0.0 0.0 - 0.1 x10E3/uL   Hematology Comments: Note:     Comment: CBC met reflex criteria for review of peripheral smear by medical laboratory professional. Automated results were confirmed by smear review.   [id number]+EGFR     Status: Abnormal   Collection Time: 06/18/24 10:15 AM  Result Value Ref Range   Glucose 142 (H) 70 - 99 mg/dL   BUN 17 8 - 27 mg/dL   Creatinine, Ser 8.61 (H) 0.76 - 1.27 mg/dL   eGFR 57 (L) >40 fO/fpw/8.26    BUN/Creatinine Ratio 12 10 - 24   Sodium 139 134 - 144 mmol/L   Potassium 4.1 3.5 - 5.2 mmol/L   Chloride 96 96 - 106 mmol/L   CO2 24 20 - 29 mmol/L   Calcium 11.0 (H) 8.6 - 10.2 mg/dL   Total Protein 6.8 6.0 - 8.5 g/dL   Albumin 4.9 3.9 - 4.9 g/dL   Globulin, Total 1.9 1.5 - 4.5 g/dL   Bilirubin Total 0.3 0.0 - 1.2 mg/dL   Alkaline Phosphatase 57 44 - 121 IU/L    Comment: **Effective June 30, 2024 Alkaline Phosphatase**   reference interval will be changing to:              Age                Male  Male           0 -  5 days         47 - 127       47 - 127           6 - 10 days         29 - 242       29 - 242          11 - 20 days        109 - 357      109 - 357          21 - 30 days         94 - 494       94 - 494           1 -  2 months      149 - 539      149 - 539           3 -  6 months      131 - 452      131 - 452           7 - 11 months      117 - 401      117 - 401   12 months -  6 years       158 - 369      158 - 369           7 - 12 years       150 - 409      150 - 409               13 years       156 - 435       78 - 227               14 years       114 - 375       64 - 161               15 years        88 - 279       56 - 134               16 years        74 - 207       51 - 121               17 years        63 - 161       47 - 113          18 - 20 years        51 - 125       42 - 106          21 - 50 years         47 - 123       41 - 116          51 - 80 years        49 - 135       51 - 125              >80 years        48 - 129       48 - 129    AST 25 0 - 40 IU/L   ALT 32 0 -  44 IU/L  Lipid Panel w/o Chol/HDL Ratio     Status: Abnormal   Collection Time: 06/18/24 10:15 AM  Result Value Ref Range   Cholesterol, Total 132 100 - 199 mg/dL   Triglycerides 602 (H) 0 - 149 mg/dL   HDL 32 (L) >60 mg/dL   VLDL Cholesterol Cal 58 (H) 5 - 40 mg/dL   LDL Chol Calc (NIH) 42 0 - 99 mg/dL  Hemoglobin J8r     Status: Abnormal   Collection Time: 06/18/24  10:15 AM  Result Value Ref Range   Hgb A1c MFr Bld 7.8 (H) 4.8 - 5.6 %    Comment:          Prediabetes: 5.7 - 6.4          Diabetes: >6.4          Glycemic control for adults with diabetes: <7.0    Est. average glucose Bld gHb Est-mCnc 177 mg/dL  Vitamin D  (25 hydroxy)     Status: None   Collection Time: 06/18/24 10:15 AM  Result Value Ref Range   Vit D, 25-Hydroxy 46.4 30.0 - 100.0 ng/mL    Comment: Vitamin D  deficiency has been defined by the Institute of Medicine and an Endocrine Society practice guideline as a level of serum 25-OH vitamin D  less than 20 ng/mL (1,2). The Endocrine Society went on to further define vitamin D  insufficiency as a level between 21 and 29 ng/mL (2). 1. IOM (Institute of Medicine). 2010. Dietary reference    intakes for calcium and D. [place]: The    [employer]. 2. Holick MF, Binkley Langdon, Bischoff-Ferrari HA, et al.    Evaluation, treatment, and prevention of vitamin D     deficiency: an Endocrine Society clinical practice    guideline. JCEM. 2011 Jul; 96(7):1911-30.   [id number]     Status: None   Collection Time: 06/18/24 10:15 AM  Result Value Ref Range   TSH 2.200 0.450 - 4.500 uIU/mL   T3, Free 3.5 2.0 - 4.4 pg/mL   Free T4 1.43 0.82 - 1.77 ng/dL  Vitamin B12     Status: None   Collection Time: 06/18/24 10:17 AM  Result Value Ref Range   Vitamin B-12 824 232 - 1,245 pg/mL  Microalbumin / creatinine urine ratio     Status: None   Collection Time: 06/18/24 10:21 AM  Result Value Ref Range   Creatinine, Urine 113.8 Not Estab. mg/dL   Microalbumin, Urine 6.1 Not Estab. ug/mL   Microalb/Creat Ratio 5 0 - 29 mg/g creat    Comment:                        Normal:                0 -  29                        Moderately increased: 30 - 300                        Severely increased:       >300       Assessment & Plan:  Continue current medication. Add Farxiga . Strict diet control emphasized. Problem List Items  Addressed This Visit     CLL (chronic lymphocytic leukemia) (HCC)   Type 2 diabetes mellitus without complication, with long-term current use of insulin  (HCC) - Primary   Relevant Medications  FARXIGA  10 MG TABS tablet   Combined hyperlipidemia associated with type 2 diabetes mellitus (HCC)   Relevant Medications   FARXIGA  10 MG TABS tablet   Hypertension associated with diabetes (HCC)   Relevant Medications   FARXIGA  10 MG TABS tablet   Vitamin D  deficiency   Hypothyroidism   Other Visit Diagnoses       Type 2 diabetes mellitus with hyperglycemia, with long-term current use of insulin  (HCC)       Relevant Medications   FARXIGA  10 MG TABS tablet       Return in about 1 month (around 07/27/2024).   Total time spent: 30 minutes  FERNAND FREDY RAMAN, MD  06/27/2024   This document may have been prepared by [facility] Voice Recognition software and as such may include unintentional dictation errors.

## 2024-07-28 ENCOUNTER — Other Ambulatory Visit: Payer: Self-pay

## 2024-07-28 DIAGNOSIS — C9110 Chronic lymphocytic leukemia of B-cell type not having achieved remission: Secondary | ICD-10-CM

## 2024-07-29 ENCOUNTER — Inpatient Hospital Stay: Attending: Oncology

## 2024-07-29 DIAGNOSIS — C9110 Chronic lymphocytic leukemia of B-cell type not having achieved remission: Secondary | ICD-10-CM | POA: Diagnosis present

## 2024-07-29 LAB — CBC WITH DIFFERENTIAL (CANCER CENTER ONLY)
Abs Immature Granulocytes: 0.03 K/uL (ref 0.00–0.07)
Basophils Absolute: 0 K/uL (ref 0.0–0.1)
Basophils Relative: 0 %
Eosinophils Absolute: 0.1 K/uL (ref 0.0–0.5)
Eosinophils Relative: 1 %
HCT: 44.5 % (ref 39.0–52.0)
Hemoglobin: 15.3 g/dL (ref 13.0–17.0)
Immature Granulocytes: 0 %
Lymphocytes Relative: 63 %
Lymphs Abs: 7.1 K/uL — ABNORMAL HIGH (ref 0.7–4.0)
MCH: 28.8 pg (ref 26.0–34.0)
MCHC: 34.4 g/dL (ref 30.0–36.0)
MCV: 83.6 fL (ref 80.0–100.0)
Monocytes Absolute: 0.5 K/uL (ref 0.1–1.0)
Monocytes Relative: 5 %
Neutro Abs: 3.6 K/uL (ref 1.7–7.7)
Neutrophils Relative %: 31 %
Platelet Count: 190 K/uL (ref 150–400)
RBC: 5.32 MIL/uL (ref 4.22–5.81)
RDW: 13.2 % (ref 11.5–15.5)
Smear Review: NORMAL
WBC Count: 11.4 K/uL — ABNORMAL HIGH (ref 4.0–10.5)
nRBC: 0 % (ref 0.0–0.2)

## 2024-07-31 ENCOUNTER — Ambulatory Visit: Admitting: Internal Medicine

## 2024-07-31 ENCOUNTER — Encounter: Payer: Self-pay | Admitting: Internal Medicine

## 2024-07-31 VITALS — BP 128/84 | HR 73 | Ht 72.0 in | Wt 242.2 lb

## 2024-07-31 DIAGNOSIS — N1831 Chronic kidney disease, stage 3a: Secondary | ICD-10-CM

## 2024-07-31 DIAGNOSIS — I152 Hypertension secondary to endocrine disorders: Secondary | ICD-10-CM

## 2024-07-31 DIAGNOSIS — Z794 Long term (current) use of insulin: Secondary | ICD-10-CM | POA: Diagnosis not present

## 2024-07-31 DIAGNOSIS — E782 Mixed hyperlipidemia: Secondary | ICD-10-CM | POA: Diagnosis not present

## 2024-07-31 DIAGNOSIS — Z23 Encounter for immunization: Secondary | ICD-10-CM

## 2024-07-31 DIAGNOSIS — E1169 Type 2 diabetes mellitus with other specified complication: Secondary | ICD-10-CM

## 2024-07-31 DIAGNOSIS — K2100 Gastro-esophageal reflux disease with esophagitis, without bleeding: Secondary | ICD-10-CM

## 2024-07-31 DIAGNOSIS — E1165 Type 2 diabetes mellitus with hyperglycemia: Secondary | ICD-10-CM

## 2024-07-31 LAB — POCT CBG (FASTING - GLUCOSE)-MANUAL ENTRY: Glucose Fasting, POC: 141 mg/dL — AB (ref 70–99)

## 2024-07-31 MED ORDER — PANTOPRAZOLE SODIUM 40 MG PO TBEC: 40.0000 mg | DELAYED_RELEASE_TABLET | Freq: Every day | ORAL | 1 refills | Status: DC

## 2024-07-31 MED ORDER — OZEMPIC (1 MG/DOSE) 2 MG/1.5ML ~~LOC~~ SOPN
1.0000 mg | PEN_INJECTOR | SUBCUTANEOUS | 1 refills | Status: DC
Start: 2024-07-31 — End: 2024-08-12

## 2024-07-31 MED ORDER — OZEMPIC (1 MG/DOSE) 2 MG/1.5ML ~~LOC~~ SOPN
1.0000 mg | PEN_INJECTOR | SUBCUTANEOUS | 1 refills | Status: DC
Start: 2024-07-31 — End: 2024-07-31

## 2024-07-31 NOTE — Progress Notes (Signed)
 Established Patient Office Visit  Subjective:  Patient ID: Anthony Harvey, male    DOB: 11/26/1960  Age: 63 y.o. MRN: [id number]  Chief Complaint  Patient presents with   Follow-up    1 month follow up    Patient is here for follow up. He reports he is doing well. He has been taking Farxiga  as prescribed and denies any urinary complications. His fasting blood sugars remain 140s in the morning and drop to 90-100 prior to dinner. He takes all his medications in the morning including insulin  toujeo 30 units and glipizide  xl 5 mg. Recommended patient to move insulin  to bedtime and keep glipizide  xl in the am. Will increase patients Ozempic  to 1 mg weekly injections at this time. He reports mild nausea in the morning on day 2 but otherwise denies any additional GI side effects.  Patient does have concerns about sensation of food getting stuck in epigastric region. States rice or grits seem to be his major triggers. When it happens he drinks large quantity of water  to relieve the sensation of food getting stuck or he rarely has to throw up to relieve the sensation. He states his previous PCP Dr. Jadali had taken him off reflux medication because he was not having any symptoms at that time. He had recent EGD 08/2023 that showed moderate inflammation to stomach and duodenal bulb. Will start protonix 40 mg daily and recommended patient to avoid dietary triggers,  eat smaller bites and take sips of beverage with every bite of food. Denies chest pain, shortness of breath, palpitations, diarrhea, constipation at this time.  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 His recent CBC showed elevated WBC's and lymphocytes. He has an appointment with them on Monday. He states that at this time they are just doing every 6 months labs as labs don't indicate any treatment at this time.  Patient reports he wants flu shot today.    No other concerns at this time.    Past Medical History:  Diagnosis Date   Arthritis    Diabetes mellitus without complication (HCC)    GERD (gastroesophageal reflux disease)    Hx of back injury 2010   fell from piping and hit back   Hyperlipidemia    Hypertension    Iron deficiency anemia    Sciatica of left side    Vertigo    rare    Past Surgical History:  Procedure Laterality Date   COLONOSCOPY WITH PROPOFOL  N/A 09/10/2023   Procedure: COLONOSCOPY WITH PROPOFOL ;  Surgeon: Jinny Carmine, MD;  Location: [facility];  Service: Endoscopy;  Laterality: N/A;   ESOPHAGOGASTRODUODENOSCOPY (EGD) WITH PROPOFOL  N/A 09/10/2023   Procedure: ESOPHAGOGASTRODUODENOSCOPY (EGD) WITH PROPOFOL ;  Surgeon: Jinny Carmine, MD;  Location: [facility];  Service: Endoscopy;  Laterality: N/A;  Diabetic   GIVENS CAPSULE STUDY N/A 09/26/2023   Procedure: GIVENS CAPSULE STUDY;  Surgeon: Jinny Carmine, MD;  Location: [facility];  Service: Endoscopy;  Laterality: N/A;   HERNIA REPAIR  08/03/2008   Right direct inguinal hernia, large Ultra Pro mesh.   KNEE ARTHROSCOPY Bilateral    KNEE ARTHROSCOPY Right 11/16/2016   Procedure: ARTHROSCOPY KNEE, lateral release, partial meniscectomy;  Surgeon: Ozell Flake, MD;  Location: [facility];  Service: Orthopedics;  Laterality: Right;   POLYPECTOMY  09/10/2023   Procedure: POLYPECTOMY;  Surgeon: Jinny Carmine, MD;  Location: [facility];  Service: Endoscopy;;   TONSILLECTOMY  as child  Social History   Socioeconomic History   Marital status: Married    Spouse name: Not on file   Number of children: Not on file   Years of education: Not on file   Highest education level: Not on file  Occupational History   Not on file  Tobacco Use   Smoking status: Never   Smokeless tobacco: Never  Vaping Use   Vaping status: Never Used  Substance and Sexual Activity   Alcohol use: No    Alcohol/week: 0.0 standard drinks of alcohol   Drug use: No   Sexual activity: Not on file   Other Topics Concern   Not on file  Social History Narrative   Not on file   Social Drivers of Health   Financial Resource Strain: Low Risk  (05/06/2024)   Received from [facility]   Overall Financial Resource Strain (CARDIA)    How hard is it for you to pay for the very basics like food, housing, medical care, and heating?: Not hard at all  Food Insecurity: No Food Insecurity (05/06/2024)   Received from [facility]   Hunger Vital Sign    Within the past 12 months, you worried that your food would run out before you got the money to buy more.: Never true    Within the past 12 months, the food you bought just didn't last and you didn't have money to get more.: Never true  Transportation Needs: No Transportation Needs (05/06/2024)   Received from [facility] - Transportation    In the past 12 months, has lack of transportation kept you from medical appointments or from getting medications?: No    In the past 12 months, has lack of transportation kept you from meetings, work, or from getting things needed for daily living?: No  Physical Activity: Not on file  Stress: Not on file  Social Connections: Not on file  Intimate Partner Violence: Not At Risk (01/01/2024)   Humiliation, Afraid, Rape, and Kick questionnaire    Fear of Current or Ex-Partner: No    Emotionally Abused: No    Physically Abused: No    Sexually Abused: No    Family History  Problem Relation Age of Onset   Cancer Mother    Diabetes Mother    Cancer Father     Allergies  Allergen Reactions   Cymbalta [Duloxetine Hcl] Other (See Comments)    Chest pain   Duloxetine Other (See Comments)   Pregabalin     Other reaction(s): Vertigo, speech problems   Celebrex [Celecoxib] Palpitations   Niacin Hives and Rash    Outpatient Medications Prior to Visit  Medication Sig   amLODipine (NORVASC) 5 MG tablet Take 5 mg by mouth daily.   aspirin  EC 81 MG tablet Take 81 mg by mouth at bedtime.    cyclobenzaprine  (FLEXERIL ) 5 MG tablet Take 5 mg by mouth once.   docusate sodium  (COLACE) 100 MG capsule Take 100 mg by mouth at bedtime. Twice a day   ezetimibe  (ZETIA ) 10 MG tablet Take 10 mg by mouth daily.   FARXIGA  10 MG TABS tablet Take 1 tablet (10 mg total) by mouth daily.   fenofibrate (TRICOR) 145 MG tablet Take 145 mg by mouth daily.   glipiZIDE  (GLUCOTROL  XL) 5 MG 24 hr tablet Take 5 mg by mouth daily.   hydrALAZINE  (APRESOLINE ) 50 MG tablet Take 1 tablet (50 mg total) by mouth 2 (two) times daily.   insulin  glargine, 1 Unit  Dial, (TOUJEO SOLOSTAR) 300 UNIT/ML Solostar Pen Inject 30 Units into the skin daily.   lisinopril  (ZESTRIL ) 20 MG tablet Take 1 tablet (20 mg total) by mouth daily. just at bedtime.   lisinopril -hydrochlorothiazide  (ZESTORETIC ) 20-25 MG tablet Take 1 tablet by mouth daily. In the morning.   metFORMIN  (GLUCOPHAGE ) 1000 MG tablet Take 1,000 mg by mouth 2 (two) times daily with a meal.   metoprolol  succinate (TOPROL -XL) 50 MG 24 hr tablet Take 50 mg by mouth daily.   Multiple Vitamins-Minerals (MULTIVITAMIN ADULTS 50+ PO) Take by mouth daily.   rosuvastatin (CRESTOR) 5 MG tablet Take 5 mg by mouth daily.   traMADol  (ULTRAM ) 50 MG tablet Take 50 mg by mouth every 6 (six) hours as needed (pain).  (Patient taking differently: Take 50 mg by mouth every 12 (twelve) hours as needed (pain).)   traMADol  (ULTRAM -ER) 300 MG 24 hr tablet Take 300 mg by mouth daily. Pt takes 300mg  in the morning   Turmeric 500 MG TABS Take by mouth daily.   [DISCONTINUED] Semaglutide ,0.25 or 0.5MG /DOS, 2 MG/3ML SOPN Inject 0.5 mg into the skin once a week.   cyanocobalamin  (VITAMIN B12) 1000 MCG tablet Take 1,000 mcg by mouth daily. (Patient not taking: Reported on 07/31/2024)   ergocalciferol  (VITAMIN D2) 1.25 MG (50000 UT) capsule Take 50,000 Units by mouth once a week. Saturday (Patient not taking: Reported on 07/31/2024)   OVER THE COUNTER MEDICATION Sciatic Ease, b12   No  facility-administered medications prior to visit.    Review of Systems  Constitutional: Negative.  Negative for chills, fever and malaise/fatigue.  HENT: Negative.  Negative for congestion and sore throat.   Eyes: Negative.  Negative for blurred vision and pain.  Respiratory: Negative.  Negative for cough and shortness of breath.   Cardiovascular: Negative.  Negative for chest pain, palpitations and leg swelling.  Gastrointestinal:  Positive for heartburn (epigastric region, food being stuck sensation) and nausea (after Ozempic  shot). Negative for abdominal pain, blood in stool, constipation, diarrhea, melena and vomiting.  Genitourinary: Negative.  Negative for dysuria, flank pain, frequency and urgency.  Musculoskeletal: Negative.  Negative for joint pain and myalgias.  Skin: Negative.   Neurological: Negative.  Negative for dizziness, tingling, sensory change, weakness and headaches.  Endo/Heme/Allergies: Negative.   Psychiatric/Behavioral: Negative.  Negative for depression and suicidal ideas. The patient is not nervous/anxious.        Objective:   BP 128/84   Pulse 73   Ht 6' (1.829 m)   Wt 242 lb 3.2 oz (109.9 kg)   SpO2 98%   BMI 32.85 kg/m   Vitals:   07/31/24 0953  BP: 128/84  Pulse: 73  Height: 6' (1.829 m)  Weight: 242 lb 3.2 oz (109.9 kg)  SpO2: 98%  BMI (Calculated): 32.84    Physical Exam Vitals and nursing note reviewed.  Constitutional:      General: He is not in acute distress.    Appearance: Normal appearance. He is not ill-appearing.  HENT:     Head: Normocephalic and atraumatic.     Nose: Nose normal.     Mouth/Throat:     Mouth: Mucous membranes are moist.     Pharynx: Oropharynx is clear.  Eyes:     Conjunctiva/sclera: Conjunctivae normal.     Pupils: Pupils are equal, round, and reactive to light.  Cardiovascular:     Rate and Rhythm: Normal rate and regular rhythm.     Pulses: Normal pulses.     Heart sounds: Normal  heart sounds.   Pulmonary:     Effort: Pulmonary effort is normal.     Breath sounds: Normal breath sounds. No wheezing or rhonchi.  Abdominal:     General: Bowel sounds are normal. There is no distension.     Palpations: Abdomen is soft.     Tenderness: There is no abdominal tenderness.  Musculoskeletal:        General: Normal range of motion.     Cervical back: Normal range of motion and neck supple.     Right lower leg: No edema.     Left lower leg: No edema.  Skin:    General: Skin is warm and dry.     Capillary Refill: Capillary refill takes less than 2 seconds.  Neurological:     General: No focal deficit present.     Mental Status: He is alert and oriented to person, place, and time.     Sensory: No sensory deficit.     Motor: No weakness.  Psychiatric:        Mood and Affect: Mood normal.        Behavior: Behavior normal.        Judgment: Judgment normal.      Results for orders placed or performed in visit on 07/31/24  POCT CBG (Fasting - Glucose)  Result Value Ref Range   Glucose Fasting, POC 141 (A) 70 - 99 mg/dL    Recent Results (from the past 2160 hours)  POCT CBG (Fasting - Glucose)     Status: Abnormal   Collection Time: 06/13/24  1:11 PM  Result Value Ref Range   Glucose Fasting, POC 249 (A) 70 - 99 mg/dL  POC CREATINE & ALBUMIN,URINE     Status: Normal   Collection Time: 06/13/24  1:49 PM  Result Value Ref Range   Microalbumin Ur, POC 10 mg/L   Creatinine, POC 100 mg/dL   Albumin/Creatinine Ratio, Urine, POC <30   CBC with Diff     Status: Abnormal   Collection Time: 06/18/24 10:15 AM  Result Value Ref Range   WBC 13.1 (H) 3.4 - 10.8 x10E3/uL   RBC 5.22 4.14 - 5.80 x10E6/uL    Comment: Polychromasia present   Hemoglobin 14.9 13.0 - 17.7 g/dL   Hematocrit 52.5 62.4 - 51.0 %   MCV 91 79 - 97 fL   MCH 28.5 26.6 - 33.0 pg   MCHC 31.4 (L) 31.5 - 35.7 g/dL   RDW 86.5 88.3 - 84.5 %   Platelets 209 150 - 450 x10E3/uL   Neutrophils 28 Not Estab. %   Lymphs 67 Not  Estab. %    Comment: Atypical lymphocytes. Smudge cells present    Monocytes 4 Not Estab. %   Eos 1 Not Estab. %   Basos 0 Not Estab. %   Neutrophils Absolute 3.7 1.4 - 7.0 x10E3/uL   Lymphocytes Absolute 8.6 (H) 0.7 - 3.1 x10E3/uL   Monocytes Absolute 0.5 0.1 - 0.9 x10E3/uL   EOS (ABSOLUTE) 0.2 0.0 - 0.4 x10E3/uL   Basophils Absolute 0.0 0.0 - 0.2 x10E3/uL   Immature Granulocytes 0 Not Estab. %   Immature Grans (Abs) 0.0 0.0 - 0.1 x10E3/uL   Hematology Comments: Note:     Comment: CBC met reflex criteria for review of peripheral smear by medical laboratory professional. Automated results were confirmed by smear review.   CMP14+EGFR     Status: Abnormal   Collection Time: 06/18/24 10:15 AM  Result Value Ref Range   Glucose 142 (  H) 70 - 99 mg/dL   BUN 17 8 - 27 mg/dL   Creatinine, Ser 8.61 (H) 0.76 - 1.27 mg/dL   eGFR 57 (L) >40 fO/fpw/8.26   BUN/Creatinine Ratio 12 10 - 24   Sodium 139 134 - 144 mmol/L   Potassium 4.1 3.5 - 5.2 mmol/L   Chloride 96 96 - 106 mmol/L   CO2 24 20 - 29 mmol/L   Calcium 11.0 (H) 8.6 - 10.2 mg/dL   Total Protein 6.8 6.0 - 8.5 g/dL   Albumin 4.9 3.9 - 4.9 g/dL   Globulin, Total 1.9 1.5 - 4.5 g/dL   Bilirubin Total 0.3 0.0 - 1.2 mg/dL   Alkaline Phosphatase 57 44 - 121 IU/L    Comment: **Effective June 30, 2024 Alkaline Phosphatase**   reference interval will be changing to:              Age                Male          Male           0 -  5 days         47 - 127       47 - 127           6 - 10 days         29 - 242       29 - 242          11 - 20 days        109 - 357      109 - 357          21 - 30 days         94 - 494       94 - 494           1 -  2 months      149 - 539      149 - 539           3 -  6 months      131 - 452      131 - 452           7 - 11 months      117 - 401      117 - 401   12 months -  6 years       158 - 369      158 - 369           7 - 12 years       150 - 409      150 - 409               13 years       156 -  435       78 - 227               14 years       114 - 375       64 - 161               15 years        88 - 279       56 - 134               16 years        74 - 207       51 -  121               17 years        63 - 161       47 - 113          18 - 20 years        51 - 125       42 - 106          21 - 50 years         47 - 123       41 - 116          51 - 80 years        49 - 135       51 - 125              >80 years        48 - 129       48 - 129    AST 25 0 - 40 IU/L   ALT 32 0 - 44 IU/L  Lipid Panel w/o Chol/HDL Ratio     Status: Abnormal   Collection Time: 06/18/24 10:15 AM  Result Value Ref Range   Cholesterol, Total 132 100 - 199 mg/dL   Triglycerides 602 (H) 0 - 149 mg/dL   HDL 32 (L) >60 mg/dL   VLDL Cholesterol Cal 58 (H) 5 - 40 mg/dL   LDL Chol Calc (NIH) 42 0 - 99 mg/dL  Hemoglobin J8r     Status: Abnormal   Collection Time: 06/18/24 10:15 AM  Result Value Ref Range   Hgb A1c MFr Bld 7.8 (H) 4.8 - 5.6 %    Comment:          Prediabetes: 5.7 - 6.4          Diabetes: >6.4          Glycemic control for adults with diabetes: <7.0    Est. average glucose Bld gHb Est-mCnc 177 mg/dL  Vitamin D  (25 hydroxy)     Status: None   Collection Time: 06/18/24 10:15 AM  Result Value Ref Range   Vit D, 25-Hydroxy 46.4 30.0 - 100.0 ng/mL    Comment: Vitamin D  deficiency has been defined by the Institute of Medicine and an Endocrine Society practice guideline as a level of serum 25-OH vitamin D  less than 20 ng/mL (1,2). The Endocrine Society went on to further define vitamin D  insufficiency as a level between 21 and 29 ng/mL (2). 1. IOM (Institute of Medicine). 2010. Dietary reference    intakes for calcium and D. [place]: The    [employer]. 2. Holick MF, Binkley [place], Bischoff-Ferrari HA, et al.    Evaluation, treatment, and prevention of vitamin D     deficiency: an Endocrine Society clinical practice    guideline. JCEM. 2011 Jul; 96(7):1911-30.    TSH+T4F+T3Free     Status: None   Collection Time: 06/18/24 10:15 AM  Result Value Ref Range   TSH 2.200 0.450 - 4.500 uIU/mL   T3, Free 3.5 2.0 - 4.4 pg/mL   Free T4 1.43 0.82 - 1.77 ng/dL  Vitamin B12     Status: None   Collection Time: 06/18/24 10:17 AM  Result Value Ref Range   Vitamin B-12 824 232 - 1,245 pg/mL  Microalbumin / creatinine urine ratio     Status: None   Collection Time: 06/18/24 10:21 AM  Result Value Ref Range   Creatinine, Urine 113.8 Not Estab.  mg/dL   Microalbumin, Urine 6.1 Not Estab. ug/mL   Microalb/Creat Ratio 5 0 - 29 mg/g creat    Comment:                        Normal:                0 -  29                        Moderately increased: 30 - 300                        Severely increased:       >300   CBC with Differential (Cancer Center Only)     Status: Abnormal   Collection Time: 07/29/24 11:15 AM  Result Value Ref Range   WBC Count 11.4 (H) 4.0 - 10.5 K/uL   RBC 5.32 4.22 - 5.81 MIL/uL   Hemoglobin 15.3 13.0 - 17.0 g/dL   HCT 55.4 60.9 - 47.9 %   MCV 83.6 80.0 - 100.0 fL   MCH 28.8 26.0 - 34.0 pg   MCHC 34.4 30.0 - 36.0 g/dL   RDW 86.7 88.4 - 84.4 %   Platelet Count 190 150 - 400 K/uL   nRBC 0.0 0.0 - 0.2 %   Neutrophils Relative % 31 %   Neutro Abs 3.6 1.7 - 7.7 K/uL   Lymphocytes Relative 63 %   Lymphs Abs 7.1 (H) 0.7 - 4.0 K/uL   Monocytes Relative 5 %   Monocytes Absolute 0.5 0.1 - 1.0 K/uL   Eosinophils Relative 1 %   Eosinophils Absolute 0.1 0.0 - 0.5 K/uL   Basophils Relative 0 %   Basophils Absolute 0.0 0.0 - 0.1 K/uL   WBC Morphology See Note     Comment: Lymphocytosis with morphology consistent with known diagnosis of CLL. SMUDGE CELLS    RBC Morphology MORPHOLOGY UNREMARKABLE    Smear Review Normal platelet morphology    Immature Granulocytes 0 %   Abs Immature Granulocytes 0.03 0.00 - 0.07 K/uL    Comment: Performed at [facility], [address] [place], [place] [address]  POCT CBG (Fasting - Glucose)      Status: Abnormal   Collection Time: 07/31/24  9:59 AM  Result Value Ref Range   Glucose Fasting, POC 141 (A) 70 - 99 mg/dL      Assessment & Plan:  Start Ozempic  1 mg weekly injections. Start Protonix 40 mg daily. Move Toujeo insulin  to night time dosing. Continue taking other medications as prescribed. Reinforced healthy diet and exercise as tolerated. Flu shot given today. Problem List Items Addressed This Visit     Combined hyperlipidemia associated with type 2 diabetes mellitus (HCC)   Relevant Medications   Semaglutide , 1 MG/DOSE, (OZEMPIC , 1 MG/DOSE,) 2 MG/1.5ML SOPN   Hypertension associated with diabetes (HCC)   Relevant Medications   Semaglutide , 1 MG/DOSE, (OZEMPIC , 1 MG/DOSE,) 2 MG/1.5ML SOPN   Type 2 diabetes mellitus with hyperglycemia, with long-term current use of insulin  (HCC) - Primary   Relevant Medications   Semaglutide , 1 MG/DOSE, (OZEMPIC , 1 MG/DOSE,) 2 MG/1.5ML SOPN   Other Relevant Orders   POCT CBG (Fasting - Glucose) (Completed)   CKD stage 3a, GFR 45-59 ml/min (HCC)   Relevant Medications   Semaglutide , 1 MG/DOSE, (OZEMPIC , 1 MG/DOSE,) 2 MG/1.5ML SOPN   Gastroesophageal reflux disease with esophagitis   Relevant Medications  pantoprazole (PROTONIX) 40 MG tablet   Needs flu shot   Relevant Orders   Influenza, MDCK, trivalent, PF(Flucelvax egg-free) (Completed)    Return in about 4 weeks (around 08/28/2024).   Total time spent: 30 minutes. This time includes review of previous notes and results and patient face to face interaction during today's visit.    FERNAND FREDY RAMAN, MD  07/31/2024   This document may have been prepared by [facility] Voice Recognition software and as such may include unintentional dictation errors.

## 2024-08-01 ENCOUNTER — Telehealth: Admitting: Oncology

## 2024-08-04 ENCOUNTER — Encounter: Payer: Self-pay | Admitting: Oncology

## 2024-08-04 ENCOUNTER — Inpatient Hospital Stay: Admitting: Oncology

## 2024-08-04 DIAGNOSIS — C9110 Chronic lymphocytic leukemia of B-cell type not having achieved remission: Secondary | ICD-10-CM | POA: Diagnosis not present

## 2024-08-04 NOTE — Progress Notes (Signed)
 [facility]  [phone]) [phone] [phone]) [phone]  ID: Anthony Harvey OB: [birth year]  MR#: [id number]  RDW#:[id number]  Patient Care Team: Fernand Fredy RAMAN, MD as PCP - General (Internal Medicine) Christi Vannie PARAS, MD as Attending Physician (Endocrinology) Jacobo Evalene PARAS, MD as Consulting Physician (Oncology)  I connected with Anthony Harvey on 08/04/24 at  3:30 PM EDT by video enabled telemedicine visit and verified that I am speaking with the correct person using two identifiers.   I discussed the limitations, risks, security and privacy concerns of performing an evaluation and management service by telemedicine and the availability of in-person appointments. I also discussed with the patient that there may be a patient responsible charge related to this service. The patient expressed understanding and agreed to proceed.   Other persons participating in the visit and their role in the encounter: Patient, MD.  Patient's location: Home. Provider's location: Clinic.  CHIEF COMPLAINT: CLL, Rai stage 0.  INTERVAL HISTORY: Patient agreed to video-assisted telemedicine visit for repeat laboratory work and routine 37-month evaluation.  He continues to feel well and remains asymptomatic.  He has no neurologic complaints.  He denies any recent fevers or illnesses.  He has a good appetite and denies weight loss.  He has no chest pain, shortness of breath, cough, or hemoptysis.  He denies any nausea, vomiting, constipation, or diarrhea.  He has no urinary complaints.  Patient offers no specific complaints today.  REVIEW OF SYSTEMS:   Review of Systems  Constitutional: Negative.  Negative for fever, malaise/fatigue and weight loss.  Respiratory: Negative.  Negative for cough, hemoptysis and shortness of breath.   Cardiovascular: Negative.  Negative for chest pain and leg swelling.  Gastrointestinal: Negative.  Negative for abdominal pain.  Genitourinary: Negative.   Negative for dysuria.  Musculoskeletal: Negative.  Negative for back pain.  Skin: Negative.  Negative for rash.  Neurological: Negative.  Negative for dizziness, focal weakness, weakness and headaches.  Psychiatric/Behavioral: Negative.  The patient is not nervous/anxious.     As per HPI. Otherwise, a complete review of systems is negative.  PAST MEDICAL HISTORY: Past Medical History:  Diagnosis Date   Arthritis    Diabetes mellitus without complication (HCC)    GERD (gastroesophageal reflux disease)    Hx of back injury 2010   fell from piping and hit back   Hyperlipidemia    Hypertension    Iron deficiency anemia    Sciatica of left side    Vertigo    rare    PAST SURGICAL HISTORY: Past Surgical History:  Procedure Laterality Date   COLONOSCOPY WITH PROPOFOL  N/A 09/10/2023   Procedure: COLONOSCOPY WITH PROPOFOL ;  Surgeon: Jinny Carmine, MD;  Location: [facility];  Service: Endoscopy;  Laterality: N/A;   ESOPHAGOGASTRODUODENOSCOPY (EGD) WITH PROPOFOL  N/A 09/10/2023   Procedure: ESOPHAGOGASTRODUODENOSCOPY (EGD) WITH PROPOFOL ;  Surgeon: Jinny Carmine, MD;  Location: [facility];  Service: Endoscopy;  Laterality: N/A;  Diabetic   GIVENS CAPSULE STUDY N/A 09/26/2023   Procedure: GIVENS CAPSULE STUDY;  Surgeon: Jinny Carmine, MD;  Location: [facility];  Service: Endoscopy;  Laterality: N/A;   HERNIA REPAIR  08/03/2008   Right direct inguinal hernia, large Ultra Pro mesh.   KNEE ARTHROSCOPY Bilateral    KNEE ARTHROSCOPY Right 11/16/2016   Procedure: ARTHROSCOPY KNEE, lateral release, partial meniscectomy;  Surgeon: Ozell Flake, MD;  Location: [facility];  Service: Orthopedics;  Laterality: Right;   POLYPECTOMY  09/10/2023   Procedure: POLYPECTOMY;  Surgeon: Jinny Carmine,  MD;  Location: [facility];  Service: Endoscopy;;   TONSILLECTOMY  as child    FAMILY HISTORY: Family History  Problem Relation Age of Onset   Cancer Mother    Diabetes Mother     Cancer Father     ADVANCED DIRECTIVES (Y/N):  N  HEALTH MAINTENANCE: Social History   Tobacco Use   Smoking status: Never   Smokeless tobacco: Never  Vaping Use   Vaping status: Never Used  Substance Use Topics   Alcohol use: No    Alcohol/week: 0.0 standard drinks of alcohol   Drug use: No     Colonoscopy:  PAP:  Bone density:  Lipid panel:  Allergies  Allergen Reactions   Cymbalta [Duloxetine Hcl] Other (See Comments)    Chest pain   Duloxetine Other (See Comments)   Pregabalin     Other reaction(s): Vertigo, speech problems   Celebrex [Celecoxib] Palpitations   Niacin Hives and Rash    Current Outpatient Medications  Medication Sig Dispense Refill   amLODipine (NORVASC) 5 MG tablet Take 5 mg by mouth daily.     aspirin  EC 81 MG tablet Take 81 mg by mouth at bedtime.     cyclobenzaprine  (FLEXERIL ) 5 MG tablet Take 5 mg by mouth once.     docusate sodium  (COLACE) 100 MG capsule Take 100 mg by mouth at bedtime. Twice a day     ezetimibe  (ZETIA ) 10 MG tablet Take 10 mg by mouth daily.     FARXIGA  10 MG TABS tablet Take 1 tablet (10 mg total) by mouth daily. 90 tablet 3   fenofibrate (TRICOR) 145 MG tablet Take 145 mg by mouth daily.     glipiZIDE  (GLUCOTROL  XL) 5 MG 24 hr tablet Take 5 mg by mouth daily.     hydrALAZINE  (APRESOLINE ) 50 MG tablet Take 1 tablet (50 mg total) by mouth 2 (two) times daily. 180 tablet 3   insulin  glargine, 1 Unit Dial, (TOUJEO SOLOSTAR) 300 UNIT/ML Solostar Pen Inject 30 Units into the skin daily.     lisinopril  (ZESTRIL ) 20 MG tablet Take 1 tablet (20 mg total) by mouth daily. just at bedtime. 90 tablet 3   lisinopril -hydrochlorothiazide  (ZESTORETIC ) 20-25 MG tablet Take 1 tablet by mouth daily. In the morning. 90 tablet 3   metFORMIN  (GLUCOPHAGE ) 1000 MG tablet Take 1,000 mg by mouth 2 (two) times daily with a meal.     metoprolol  succinate (TOPROL -XL) 50 MG 24 hr tablet Take 50 mg by mouth daily.     Multiple Vitamins-Minerals  (MULTIVITAMIN ADULTS 50+ PO) Take by mouth daily.     OVER THE COUNTER MEDICATION Sciatic Ease, b12     pantoprazole (PROTONIX) 40 MG tablet Take 1 tablet (40 mg total) by mouth daily. 30 tablet 1   rosuvastatin (CRESTOR) 5 MG tablet Take 5 mg by mouth daily.     Semaglutide , 1 MG/DOSE, (OZEMPIC , 1 MG/DOSE,) 2 MG/1.5ML SOPN Inject 1 mg into the skin once a week. 3 mL 1   traMADol  (ULTRAM ) 50 MG tablet Take 50 mg by mouth every 6 (six) hours as needed (pain).  (Patient taking differently: Take 50 mg by mouth every 12 (twelve) hours as needed (pain).)     traMADol  (ULTRAM -ER) 300 MG 24 hr tablet Take 300 mg by mouth daily. Pt takes 300mg  in the morning     Turmeric 500 MG TABS Take by mouth daily.     cyanocobalamin  (VITAMIN B12) 1000 MCG tablet Take 1,000 mcg by mouth daily. (  Patient not taking: Reported on 08/04/2024)     ergocalciferol  (VITAMIN D2) 1.25 MG (50000 UT) capsule Take 50,000 Units by mouth once a week. Saturday (Patient not taking: Reported on 08/04/2024)     No current facility-administered medications for this visit.    OBJECTIVE: There were no vitals filed for this visit.    There is no height or weight on file to calculate BMI.    ECOG FS:0 - Asymptomatic  General: Well-developed, well-nourished, no acute distress. HEENT: Normocephalic. Neuro: Alert, answering all questions appropriately. Cranial nerves grossly intact. Psych: Normal affect.  LAB RESULTS:  Lab Results  Component Value Date   NA 139 06/18/2024   K 4.1 06/18/2024   CL 96 06/18/2024   CO2 24 06/18/2024   GLUCOSE 142 (H) 06/18/2024   BUN 17 06/18/2024   CREATININE 1.38 (H) 06/18/2024   CALCIUM 11.0 (H) 06/18/2024   PROT 6.8 06/18/2024   ALBUMIN 4.9 06/18/2024   AST 25 06/18/2024   ALT 32 06/18/2024   ALKPHOS 57 06/18/2024   BILITOT 0.3 06/18/2024   GFRNONAA 52 (L) 12/24/2023   GFRAA >60 07/12/2012    Lab Results  Component Value Date   WBC 11.4 (H) 07/29/2024   NEUTROABS 3.6 07/29/2024    HGB 15.3 07/29/2024   HCT 44.5 07/29/2024   MCV 83.6 07/29/2024   PLT 190 07/29/2024     STUDIES: No results found.   ASSESSMENT: CLL, Rai stage 0.  PLAN:    CLL, Rai stage 0: Confirmed by peripheral blood flow cytometry.  Patient only has a mildly elevated total white blood cell count ranging between 10.6 and 13.4 since December 19, 2023.  Today's result is 11.4.  He has no other notable cytopenias.  He had a CT scan of the chest on December 19, 2023 that did not report any lymphadenopathy.  Patient does not require additional imaging or bone marrow biopsy at this time.  Return to clinic in 6 months for laboratory work only and then in 1 year for laboratory work and evaluation by APP.  I provided 20 minutes of face-to-face video visit time during this encounter which included chart review, counseling, and coordination of care as documented above.  Patient expressed understanding and was in agreement with this plan. He also understands that He can call clinic at any time with any questions, concerns, or complaints.    Cancer Staging  CLL (chronic lymphocytic leukemia) (HCC) Staging form: Chronic Lymphocytic Leukemia / Small Lymphocytic Lymphoma, AJCC 8th Edition - Clinical stage from 02/01/2024: Modified Rai Stage 0 (Modified Rai risk: Low, Lymphocytosis: Present, Adenopathy: Absent, Organomegaly: Absent, Anemia: Absent, Thrombocytopenia: Absent) - Signed by Jacobo Evalene PARAS, MD on 02/01/2024 Stage prefix: Initial diagnosis   Evalene PARAS Jacobo, MD   08/04/2024 4:08 PM

## 2024-08-04 NOTE — Progress Notes (Signed)
 Patient said that he is doing ok, no new questions for the doctor today.

## 2024-08-12 ENCOUNTER — Other Ambulatory Visit: Payer: Self-pay | Admitting: Internal Medicine

## 2024-08-12 DIAGNOSIS — E1165 Type 2 diabetes mellitus with hyperglycemia: Secondary | ICD-10-CM

## 2024-08-12 DIAGNOSIS — N1831 Chronic kidney disease, stage 3a: Secondary | ICD-10-CM

## 2024-08-12 DIAGNOSIS — E1169 Type 2 diabetes mellitus with other specified complication: Secondary | ICD-10-CM

## 2024-08-12 DIAGNOSIS — I152 Hypertension secondary to endocrine disorders: Secondary | ICD-10-CM

## 2024-08-28 ENCOUNTER — Ambulatory Visit: Payer: Self-pay | Admitting: Internal Medicine

## 2024-08-28 ENCOUNTER — Encounter: Payer: Self-pay | Admitting: Internal Medicine

## 2024-08-28 ENCOUNTER — Ambulatory Visit (INDEPENDENT_AMBULATORY_CARE_PROVIDER_SITE_OTHER): Admitting: Internal Medicine

## 2024-08-28 VITALS — BP 116/68 | HR 77 | Ht 72.0 in | Wt 243.4 lb

## 2024-08-28 DIAGNOSIS — E1159 Type 2 diabetes mellitus with other circulatory complications: Secondary | ICD-10-CM | POA: Diagnosis not present

## 2024-08-28 DIAGNOSIS — E1169 Type 2 diabetes mellitus with other specified complication: Secondary | ICD-10-CM

## 2024-08-28 DIAGNOSIS — E559 Vitamin D deficiency, unspecified: Secondary | ICD-10-CM | POA: Diagnosis not present

## 2024-08-28 DIAGNOSIS — K2100 Gastro-esophageal reflux disease with esophagitis, without bleeding: Secondary | ICD-10-CM

## 2024-08-28 DIAGNOSIS — E782 Mixed hyperlipidemia: Secondary | ICD-10-CM

## 2024-08-28 DIAGNOSIS — E781 Pure hyperglyceridemia: Secondary | ICD-10-CM

## 2024-08-28 DIAGNOSIS — E039 Hypothyroidism, unspecified: Secondary | ICD-10-CM

## 2024-08-28 DIAGNOSIS — I152 Hypertension secondary to endocrine disorders: Secondary | ICD-10-CM

## 2024-08-28 DIAGNOSIS — Z6833 Body mass index (BMI) 33.0-33.9, adult: Secondary | ICD-10-CM

## 2024-08-28 DIAGNOSIS — E119 Type 2 diabetes mellitus without complications: Secondary | ICD-10-CM

## 2024-08-28 DIAGNOSIS — Z794 Long term (current) use of insulin: Secondary | ICD-10-CM | POA: Diagnosis not present

## 2024-08-28 DIAGNOSIS — E66811 Obesity, class 1: Secondary | ICD-10-CM

## 2024-08-28 DIAGNOSIS — N1831 Chronic kidney disease, stage 3a: Secondary | ICD-10-CM

## 2024-08-28 DIAGNOSIS — E1165 Type 2 diabetes mellitus with hyperglycemia: Secondary | ICD-10-CM

## 2024-08-28 DIAGNOSIS — E6609 Other obesity due to excess calories: Secondary | ICD-10-CM | POA: Insufficient documentation

## 2024-08-28 LAB — POCT CBG (FASTING - GLUCOSE)-MANUAL ENTRY: Glucose Fasting, POC: 117 mg/dL — AB (ref 70–99)

## 2024-08-28 MED ORDER — METOPROLOL SUCCINATE ER 50 MG PO TB24: 50.0000 mg | ORAL_TABLET | Freq: Every day | ORAL | 3 refills | Status: AC

## 2024-08-28 MED ORDER — LISINOPRIL 20 MG PO TABS: 20.0000 mg | ORAL_TABLET | Freq: Every day | ORAL | 3 refills | Status: AC

## 2024-08-28 MED ORDER — FENOFIBRATE 145 MG PO TABS: 145.0000 mg | ORAL_TABLET | Freq: Every day | ORAL | 3 refills | Status: AC

## 2024-08-28 MED ORDER — LISINOPRIL-HYDROCHLOROTHIAZIDE 20-25 MG PO TABS
1.0000 | ORAL_TABLET | Freq: Every day | ORAL | 3 refills | Status: AC
Start: 2024-08-28 — End: ?

## 2024-08-28 MED ORDER — TOUJEO SOLOSTAR 300 UNIT/ML ~~LOC~~ SOPN: 30.0000 [IU] | PEN_INJECTOR | Freq: Every day | SUBCUTANEOUS | 6 refills | Status: AC

## 2024-08-28 MED ORDER — GLIPIZIDE ER 5 MG PO TB24: 5.0000 mg | ORAL_TABLET | Freq: Every day | ORAL | 3 refills | Status: AC

## 2024-08-28 MED ORDER — PANTOPRAZOLE SODIUM 40 MG PO TBEC: 40.0000 mg | DELAYED_RELEASE_TABLET | Freq: Every day | ORAL | 3 refills | Status: AC

## 2024-08-28 MED ORDER — HYDRALAZINE HCL 50 MG PO TABS: 50.0000 mg | ORAL_TABLET | Freq: Two times a day (BID) | ORAL | 3 refills | Status: AC

## 2024-08-28 MED ORDER — METFORMIN HCL 1000 MG PO TABS: 1000.0000 mg | ORAL_TABLET | Freq: Two times a day (BID) | ORAL | 3 refills | Status: AC

## 2024-08-28 MED ORDER — EZETIMIBE 10 MG PO TABS: 10.0000 mg | ORAL_TABLET | Freq: Every day | ORAL | 3 refills | Status: DC

## 2024-08-28 MED ORDER — ROSUVASTATIN CALCIUM 5 MG PO TABS: 5.0000 mg | ORAL_TABLET | Freq: Every day | ORAL | 3 refills | Status: AC

## 2024-08-28 MED ORDER — AMLODIPINE BESYLATE 5 MG PO TABS: 5.0000 mg | ORAL_TABLET | Freq: Every day | ORAL | 3 refills | Status: AC

## 2024-08-28 MED ORDER — FARXIGA 10 MG PO TABS: 10.0000 mg | ORAL_TABLET | Freq: Every day | ORAL | 3 refills | Status: AC

## 2024-08-28 NOTE — Progress Notes (Signed)
 Established Patient Office Visit  Subjective:  Patient ID: Anthony Harvey, male    DOB: 1961/09/30  Age: 63 y.o. MRN: [id number]  Chief Complaint  Patient presents with   Follow-up    4 week follow up    Patient is here today for follow up. He reports doing well and has no new complaints. He reports that he moved his insulin  to night time dosing and his morning sugars are around 130 and in the evning they are 110-120s. He has no GI symptoms with her Ozempic  1 mg injection. Will keep medications the same at this time. He is due for labs at his next visit.  He also reports improvement in her reflux since taking the Protonix daily. He denies chest pain, shortness of breath, palpitations, abdominal distress, headaches at this time.    No other concerns at this time.   Past Medical History:  Diagnosis Date   Arthritis    Diabetes mellitus without complication (HCC)    GERD (gastroesophageal reflux disease)    Hx of back injury 2010   fell from piping and hit back   Hyperlipidemia    Hypertension    Iron deficiency anemia    Sciatica of left side    Vertigo    rare    Past Surgical History:  Procedure Laterality Date   COLONOSCOPY WITH PROPOFOL  N/A 09/10/2023   Procedure: COLONOSCOPY WITH PROPOFOL ;  Surgeon: Jinny Carmine, MD;  Location: [facility];  Service: Endoscopy;  Laterality: N/A;   ESOPHAGOGASTRODUODENOSCOPY (EGD) WITH PROPOFOL  N/A 09/10/2023   Procedure: ESOPHAGOGASTRODUODENOSCOPY (EGD) WITH PROPOFOL ;  Surgeon: Jinny Carmine, MD;  Location: [facility];  Service: Endoscopy;  Laterality: N/A;  Diabetic   GIVENS CAPSULE STUDY N/A 09/26/2023   Procedure: GIVENS CAPSULE STUDY;  Surgeon: Jinny Carmine, MD;  Location: [facility];  Service: Endoscopy;  Laterality: N/A;   HERNIA REPAIR  08/03/2008   Right direct inguinal hernia, large Ultra Pro mesh.   KNEE ARTHROSCOPY Bilateral    KNEE ARTHROSCOPY Right 11/16/2016   Procedure: ARTHROSCOPY KNEE, lateral  release, partial meniscectomy;  Surgeon: Ozell Flake, MD;  Location: [facility] ORS;  Service: Orthopedics;  Laterality: Right;   POLYPECTOMY  09/10/2023   Procedure: POLYPECTOMY;  Surgeon: Jinny Carmine, MD;  Location: [facility];  Service: Endoscopy;;   TONSILLECTOMY  as child    Social History   Socioeconomic History   Marital status: Married    Spouse name: Not on file   Number of children: Not on file   Years of education: Not on file   Highest education level: Not on file  Occupational History   Not on file  Tobacco Use   Smoking status: Never   Smokeless tobacco: Never  Vaping Use   Vaping status: Never Used  Substance and Sexual Activity   Alcohol use: No    Alcohol/week: 0.0 standard drinks of alcohol   Drug use: No   Sexual activity: Not on file  Other Topics Concern   Not on file  Social History Narrative   Not on file   Social Drivers of Health   Financial Resource Strain: Low Risk  (05/06/2024)   Received from [facility]   Overall Financial Resource Strain (CARDIA)    How hard is it for you to pay for the very basics like food, housing, medical care, and heating?: Not hard at all  Food Insecurity: No Food Insecurity (05/06/2024)   Received from [facility]   Hunger Vital Sign  Within the past 12 months, you worried that your food would run out before you got the money to buy more.: Never true    Within the past 12 months, the food you bought just didn't last and you didn't have money to get more.: Never true  Transportation Harvey: No Transportation Harvey (05/06/2024)   Received from [facility] - Transportation    In the past 12 months, has lack of transportation kept you from medical appointments or from getting medications?: No    In the past 12 months, has lack of transportation kept you from meetings, work, or from getting things needed for daily living?: No  Physical Activity: Not on file  Stress: Not on file  Social Connections:  Not on file  Intimate Partner Violence: Not At Risk (01/01/2024)   Humiliation, Afraid, Rape, and Kick questionnaire    Fear of Current or Ex-Partner: No    Emotionally Abused: No    Physically Abused: No    Sexually Abused: No    Family History  Problem Relation Age of Onset   Cancer Mother    Diabetes Mother    Cancer Father     Allergies  Allergen Reactions   Cymbalta [Duloxetine Hcl] Other (See Comments)    Chest pain   Duloxetine Other (See Comments)   Pregabalin     Other reaction(s): Vertigo, speech problems   Celebrex [Celecoxib] Palpitations   Niacin Hives and Rash    Outpatient Medications Prior to Visit  Medication Sig   aspirin  EC 81 MG tablet Take 81 mg by mouth at bedtime.   cyclobenzaprine  (FLEXERIL ) 5 MG tablet Take 5 mg by mouth once.   docusate sodium  (COLACE) 100 MG capsule Take 100 mg by mouth at bedtime. Twice a day   Multiple Vitamins-Minerals (MULTIVITAMIN ADULTS 50+ PO) Take by mouth daily.   OVER THE COUNTER MEDICATION Sciatic Ease, b12   OZEMPIC , 1 MG/DOSE, 4 MG/3ML SOPN INJECT SUBCUTANEOUSLY 1 MG EVERY WEEK   traMADol  (ULTRAM ) 50 MG tablet Take 50 mg by mouth every 6 (six) hours as needed (pain).  (Patient taking differently: Take 50 mg by mouth every 12 (twelve) hours as needed (pain).)   traMADol  (ULTRAM -ER) 300 MG 24 hr tablet Take 300 mg by mouth daily. Pt takes 300mg  in the morning   Turmeric 500 MG TABS Take by mouth daily.   [DISCONTINUED] amLODipine (NORVASC) 5 MG tablet Take 5 mg by mouth daily.   [DISCONTINUED] ezetimibe  (ZETIA ) 10 MG tablet Take 10 mg by mouth daily.   [DISCONTINUED] FARXIGA  10 MG TABS tablet Take 1 tablet (10 mg total) by mouth daily.   [DISCONTINUED] fenofibrate (TRICOR) 145 MG tablet Take 145 mg by mouth daily.   [DISCONTINUED] glipiZIDE  (GLUCOTROL  XL) 5 MG 24 hr tablet Take 5 mg by mouth daily.   [DISCONTINUED] hydrALAZINE  (APRESOLINE ) 50 MG tablet Take 1 tablet (50 mg total) by mouth 2 (two) times daily.    [DISCONTINUED] insulin  glargine, 1 Unit Dial, (TOUJEO SOLOSTAR) 300 UNIT/ML Solostar Pen Inject 30 Units into the skin daily.   [DISCONTINUED] lisinopril  (ZESTRIL ) 20 MG tablet Take 1 tablet (20 mg total) by mouth daily. just at bedtime.   [DISCONTINUED] lisinopril -hydrochlorothiazide  (ZESTORETIC ) 20-25 MG tablet Take 1 tablet by mouth daily. In the morning.   [DISCONTINUED] metFORMIN  (GLUCOPHAGE ) 1000 MG tablet Take 1,000 mg by mouth 2 (two) times daily with a meal.   [DISCONTINUED] metoprolol  succinate (TOPROL -XL) 50 MG 24 hr tablet Take 50 mg by mouth daily.   [  DISCONTINUED] pantoprazole (PROTONIX) 40 MG tablet Take 1 tablet (40 mg total) by mouth daily.   [DISCONTINUED] rosuvastatin (CRESTOR) 5 MG tablet Take 5 mg by mouth daily.   cyanocobalamin  (VITAMIN B12) 1000 MCG tablet Take 1,000 mcg by mouth daily. (Patient not taking: Reported on 08/28/2024)   ergocalciferol  (VITAMIN D2) 1.25 MG (50000 UT) capsule Take 50,000 Units by mouth once a week. Saturday (Patient not taking: Reported on 08/28/2024)   No facility-administered medications prior to visit.    Review of Systems  Constitutional: Negative.  Negative for chills, fever and malaise/fatigue.  HENT: Negative.  Negative for congestion and sore throat.   Eyes: Negative.  Negative for blurred vision and pain.  Respiratory: Negative.  Negative for cough and shortness of breath.   Cardiovascular: Negative.  Negative for chest pain, palpitations and leg swelling.  Gastrointestinal: Negative.  Negative for abdominal pain, blood in stool, constipation, diarrhea, heartburn, melena, nausea and vomiting.  Genitourinary: Negative.  Negative for dysuria, flank pain, frequency and urgency.  Musculoskeletal: Negative.  Negative for joint pain and myalgias.  Skin: Negative.   Neurological: Negative.  Negative for dizziness, tingling, sensory change, weakness and headaches.  Endo/Heme/Allergies: Negative.   Psychiatric/Behavioral: Negative.   Negative for depression and suicidal ideas. The patient is not nervous/anxious.        Objective:   BP 116/68   Pulse 77   Ht 6' (1.829 m)   Wt 243 lb 6.4 oz (110.4 kg)   SpO2 97%   BMI 33.01 kg/m   Vitals:   08/28/24 0941  BP: 116/68  Pulse: 77  Height: 6' (1.829 m)  Weight: 243 lb 6.4 oz (110.4 kg)  SpO2: 97%  BMI (Calculated): 33    Physical Exam Vitals and nursing note reviewed.  Constitutional:      General: He is not in acute distress.    Appearance: Normal appearance. He is not ill-appearing.  HENT:     Head: Normocephalic and atraumatic.     Nose: Nose normal.     Mouth/Throat:     Mouth: Mucous membranes are moist.     Pharynx: Oropharynx is clear.  Eyes:     Conjunctiva/sclera: Conjunctivae normal.     Pupils: Pupils are equal, round, and reactive to light.  Cardiovascular:     Rate and Rhythm: Normal rate and regular rhythm.     Pulses: Normal pulses.     Heart sounds: Normal heart sounds.  Pulmonary:     Effort: Pulmonary effort is normal.     Breath sounds: Normal breath sounds. No wheezing or rhonchi.  Abdominal:     General: Bowel sounds are normal. There is no distension.     Palpations: Abdomen is soft.     Tenderness: There is no abdominal tenderness.  Musculoskeletal:        General: Normal range of motion.     Cervical back: Normal range of motion and neck supple.     Right lower leg: No edema.     Left lower leg: No edema.  Skin:    General: Skin is warm and dry.     Capillary Refill: Capillary refill takes less than 2 seconds.  Neurological:     General: No focal deficit present.     Mental Status: He is alert and oriented to person, place, and time.     Sensory: No sensory deficit.     Motor: No weakness.  Psychiatric:        Mood and Affect: Mood normal.  Behavior: Behavior normal.        Judgment: Judgment normal.      Results for orders placed or performed in visit on 08/28/24  POCT CBG (Fasting - Glucose)   Result Value Ref Range   Glucose Fasting, POC 117 (A) 70 - 99 mg/dL    Recent Results (from the past 2160 hours)  POCT CBG (Fasting - Glucose)     Status: Abnormal   Collection Time: 06/13/24  1:11 PM  Result Value Ref Range   Glucose Fasting, POC 249 (A) 70 - 99 mg/dL  POC CREATINE & ALBUMIN,URINE     Status: Normal   Collection Time: 06/13/24  1:49 PM  Result Value Ref Range   Microalbumin Ur, POC 10 mg/L   Creatinine, POC 100 mg/dL   Albumin/Creatinine Ratio, Urine, POC <30   CBC with Diff     Status: Abnormal   Collection Time: 06/18/24 10:15 AM  Result Value Ref Range   WBC 13.1 (H) 3.4 - 10.8 x10E3/uL   RBC 5.22 4.14 - 5.80 x10E6/uL    Comment: Polychromasia present   Hemoglobin 14.9 13.0 - 17.7 g/dL   Hematocrit 52.5 62.4 - 51.0 %   MCV 91 79 - 97 fL   MCH 28.5 26.6 - 33.0 pg   MCHC 31.4 (L) 31.5 - 35.7 g/dL   RDW 86.5 88.3 - 84.5 %   Platelets 209 150 - 450 x10E3/uL   Neutrophils 28 Not Estab. %   Lymphs 67 Not Estab. %    Comment: Atypical lymphocytes. Smudge cells present    Monocytes 4 Not Estab. %   Eos 1 Not Estab. %   Basos 0 Not Estab. %   Neutrophils Absolute 3.7 1.4 - 7.0 x10E3/uL   Lymphocytes Absolute 8.6 (H) 0.7 - 3.1 x10E3/uL   Monocytes Absolute 0.5 0.1 - 0.9 x10E3/uL   EOS (ABSOLUTE) 0.2 0.0 - 0.4 x10E3/uL   Basophils Absolute 0.0 0.0 - 0.2 x10E3/uL   Immature Granulocytes 0 Not Estab. %   Immature Grans (Abs) 0.0 0.0 - 0.1 x10E3/uL   Hematology Comments: Note:     Comment: CBC met reflex criteria for review of peripheral smear by medical laboratory professional. Automated results were confirmed by smear review.   CMP14+EGFR     Status: Abnormal   Collection Time: 06/18/24 10:15 AM  Result Value Ref Range   Glucose 142 (H) 70 - 99 mg/dL   BUN 17 8 - 27 mg/dL   Creatinine, Ser 8.61 (H) 0.76 - 1.27 mg/dL   eGFR 57 (L) >40 fO/fpw/8.26   BUN/Creatinine Ratio 12 10 - 24   Sodium 139 134 - 144 mmol/L   Potassium 4.1 3.5 - 5.2 mmol/L    Chloride 96 96 - 106 mmol/L   CO2 24 20 - 29 mmol/L   Calcium 11.0 (H) 8.6 - 10.2 mg/dL   Total Protein 6.8 6.0 - 8.5 g/dL   Albumin 4.9 3.9 - 4.9 g/dL   Globulin, Total 1.9 1.5 - 4.5 g/dL   Bilirubin Total 0.3 0.0 - 1.2 mg/dL   Alkaline Phosphatase 57 44 - 121 IU/L    Comment: **Effective June 30, 2024 Alkaline Phosphatase**   reference interval will be changing to:              Age                Male          Male  0 -  5 days         47 - 127       47 - 127           6 - 10 days         29 - 242       29 - 242          11 - 20 days        109 - 357      109 - 357          21 - 30 days         94 - 494       94 - 494           1 -  2 months      149 - 539      149 - 539           3 -  6 months      131 - 452      131 - 452           7 - 11 months      117 - 401      117 - 401   12 months -  6 years       158 - 369      158 - 369           7 - 12 years       150 - 409      150 - 409               13 years       156 - 435       78 - 227               14 years       114 - 375       64 - 161               15 years        88 - 279       56 - 134               16 years        74 - 207       51 - 121               17 years        63 - 161       47 - 113          18 - 20 years        51 - 125       42 - 106          21 - 50 years         47 - 123       41 - 116          51 - 80 years        49 - 135       51 - 125              >80 years        48 - 129       48 - 129    AST 25 0 - 40 IU/L   ALT 32 0 - 44 IU/L  Lipid Panel w/o Chol/HDL Ratio  Status: Abnormal   Collection Time: 06/18/24 10:15 AM  Result Value Ref Range   Cholesterol, Total 132 100 - 199 mg/dL   Triglycerides 602 (H) 0 - 149 mg/dL   HDL 32 (L) >60 mg/dL   VLDL Cholesterol Cal 58 (H) 5 - 40 mg/dL   LDL Chol Calc (NIH) 42 0 - 99 mg/dL  Hemoglobin J8r     Status: Abnormal   Collection Time: 06/18/24 10:15 AM  Result Value Ref Range   Hgb A1c MFr Bld 7.8 (H) 4.8 - 5.6 %    Comment:           Prediabetes: 5.7 - 6.4          Diabetes: >6.4          Glycemic control for adults with diabetes: <7.0    Est. average glucose Bld gHb Est-mCnc 177 mg/dL  Vitamin D  (25 hydroxy)     Status: None   Collection Time: 06/18/24 10:15 AM  Result Value Ref Range   Vit D, 25-Hydroxy 46.4 30.0 - 100.0 ng/mL    Comment: Vitamin D  deficiency has been defined by the Institute of Medicine and an Endocrine Society practice guideline as a level of serum 25-OH vitamin D  less than 20 ng/mL (1,2). The Endocrine Society went on to further define vitamin D  insufficiency as a level between 21 and 29 ng/mL (2). 1. IOM (Institute of Medicine). 2010. Dietary reference    intakes for calcium and D. [place]: The    [employer]. 2. Holick MF, Binkley [place], Bischoff-Ferrari HA, et al.    Evaluation, treatment, and prevention of vitamin D     deficiency: an Endocrine Society clinical practice    guideline. JCEM. 2011 Jul; 96(7):1911-30.   TSH+T4F+T3Free     Status: None   Collection Time: 06/18/24 10:15 AM  Result Value Ref Range   TSH 2.200 0.450 - 4.500 uIU/mL   T3, Free 3.5 2.0 - 4.4 pg/mL   Free T4 1.43 0.82 - 1.77 ng/dL  Vitamin B12     Status: None   Collection Time: 06/18/24 10:17 AM  Result Value Ref Range   Vitamin B-12 824 232 - 1,245 pg/mL  Microalbumin / creatinine urine ratio     Status: None   Collection Time: 06/18/24 10:21 AM  Result Value Ref Range   Creatinine, Urine 113.8 Not Estab. mg/dL   Microalbumin, Urine 6.1 Not Estab. ug/mL   Microalb/Creat Ratio 5 0 - 29 mg/g creat    Comment:                        Normal:                0 -  29                        Moderately increased: 30 - 300                        Severely increased:       >300   CBC with Differential (Cancer Center Only)     Status: Abnormal   Collection Time: 07/29/24 11:15 AM  Result Value Ref Range   WBC Count 11.4 (H) 4.0 - 10.5 K/uL   RBC 5.32 4.22 - 5.81 MIL/uL   Hemoglobin 15.3 13.0 - 17.0  g/dL   HCT 55.4 60.9 - 47.9 %   MCV 83.6 80.0 - 100.0  fL   MCH 28.8 26.0 - 34.0 pg   MCHC 34.4 30.0 - 36.0 g/dL   RDW 86.7 88.4 - 84.4 %   Platelet Count 190 150 - 400 K/uL   nRBC 0.0 0.0 - 0.2 %   Neutrophils Relative % 31 %   Neutro Abs 3.6 1.7 - 7.7 K/uL   Lymphocytes Relative 63 %   Lymphs Abs 7.1 (H) 0.7 - 4.0 K/uL   Monocytes Relative 5 %   Monocytes Absolute 0.5 0.1 - 1.0 K/uL   Eosinophils Relative 1 %   Eosinophils Absolute 0.1 0.0 - 0.5 K/uL   Basophils Relative 0 %   Basophils Absolute 0.0 0.0 - 0.1 K/uL   WBC Morphology See Note     Comment: Lymphocytosis with morphology consistent with known diagnosis of CLL. SMUDGE CELLS    RBC Morphology MORPHOLOGY UNREMARKABLE    Smear Review Normal platelet morphology    Immature Granulocytes 0 %   Abs Immature Granulocytes 0.03 0.00 - 0.07 K/uL    Comment: Performed at [facility], [address] [place], [place] [address]  POCT CBG (Fasting - Glucose)     Status: Abnormal   Collection Time: 07/31/24  9:59 AM  Result Value Ref Range   Glucose Fasting, POC 141 (A) 70 - 99 mg/dL  POCT CBG (Fasting - Glucose)     Status: Abnormal   Collection Time: 08/28/24  9:44 AM  Result Value Ref Range   Glucose Fasting, POC 117 (A) 70 - 99 mg/dL      Assessment & Plan:  Medication refills sent. Continue medications. Reinforced healthy diet and exercise as tolerated. Labs due at next visit. Schedule AWV. Problem List Items Addressed This Visit     Type 2 diabetes mellitus without complication, with long-term current use of insulin  (HCC)   Relevant Medications   FARXIGA  10 MG TABS tablet   glipiZIDE  (GLUCOTROL  XL) 5 MG 24 hr tablet   insulin  glargine, 1 Unit Dial, (TOUJEO SOLOSTAR) 300 UNIT/ML Solostar Pen   lisinopril  (ZESTRIL ) 20 MG tablet   lisinopril -hydrochlorothiazide  (ZESTORETIC ) 20-25 MG tablet   metFORMIN  (GLUCOPHAGE ) 1000 MG tablet   rosuvastatin (CRESTOR) 5 MG tablet   Other Relevant Orders   Hemoglobin A1c    Combined hyperlipidemia associated with type 2 diabetes mellitus (HCC)   Relevant Medications   amLODipine (NORVASC) 5 MG tablet   ezetimibe  (ZETIA ) 10 MG tablet   FARXIGA  10 MG TABS tablet   fenofibrate (TRICOR) 145 MG tablet   glipiZIDE  (GLUCOTROL  XL) 5 MG 24 hr tablet   hydrALAZINE  (APRESOLINE ) 50 MG tablet   insulin  glargine, 1 Unit Dial, (TOUJEO SOLOSTAR) 300 UNIT/ML Solostar Pen   lisinopril  (ZESTRIL ) 20 MG tablet   lisinopril -hydrochlorothiazide  (ZESTORETIC ) 20-25 MG tablet   metFORMIN  (GLUCOPHAGE ) 1000 MG tablet   metoprolol  succinate (TOPROL -XL) 50 MG 24 hr tablet   rosuvastatin (CRESTOR) 5 MG tablet   Other Relevant Orders   Lipid Panel w/o Chol/HDL Ratio   Hypertension associated with diabetes (HCC) - Primary   Relevant Medications   amLODipine (NORVASC) 5 MG tablet   ezetimibe  (ZETIA ) 10 MG tablet   FARXIGA  10 MG TABS tablet   fenofibrate (TRICOR) 145 MG tablet   glipiZIDE  (GLUCOTROL  XL) 5 MG 24 hr tablet   hydrALAZINE  (APRESOLINE ) 50 MG tablet   insulin  glargine, 1 Unit Dial, (TOUJEO SOLOSTAR) 300 UNIT/ML Solostar Pen   lisinopril  (ZESTRIL ) 20 MG tablet   lisinopril -hydrochlorothiazide  (ZESTORETIC ) 20-25 MG tablet   metFORMIN  (GLUCOPHAGE ) 1000 MG tablet  metoprolol  succinate (TOPROL -XL) 50 MG 24 hr tablet   rosuvastatin (CRESTOR) 5 MG tablet   Other Relevant Orders   CMP14+EGFR   CBC with Diff   Vitamin D  deficiency   Relevant Orders   Vitamin D  (25 hydroxy)   Hypothyroidism   Relevant Medications   metoprolol  succinate (TOPROL -XL) 50 MG 24 hr tablet   Other Relevant Orders   TSH+T4F+T3Free   Type 2 diabetes mellitus with hyperglycemia, with long-term current use of insulin  (HCC)   Relevant Medications   FARXIGA  10 MG TABS tablet   glipiZIDE  (GLUCOTROL  XL) 5 MG 24 hr tablet   insulin  glargine, 1 Unit Dial, (TOUJEO SOLOSTAR) 300 UNIT/ML Solostar Pen   lisinopril  (ZESTRIL ) 20 MG tablet   lisinopril -hydrochlorothiazide  (ZESTORETIC ) 20-25 MG tablet    metFORMIN  (GLUCOPHAGE ) 1000 MG tablet   rosuvastatin (CRESTOR) 5 MG tablet   Other Relevant Orders   POCT CBG (Fasting - Glucose) (Completed)   CKD stage 3a, GFR 45-59 ml/min (HCC)   Gastroesophageal reflux disease with esophagitis   Relevant Medications   pantoprazole (PROTONIX) 40 MG tablet   Class 1 obesity due to excess calories with serious comorbidity and body mass index (BMI) of 33.0 to 33.9 in adult   Relevant Medications   FARXIGA  10 MG TABS tablet   glipiZIDE  (GLUCOTROL  XL) 5 MG 24 hr tablet   insulin  glargine, 1 Unit Dial, (TOUJEO SOLOSTAR) 300 UNIT/ML Solostar Pen   metFORMIN  (GLUCOPHAGE ) 1000 MG tablet    Return in about 4 weeks (around 09/25/2024).   Total time spent: 20 minutes. This time includes review of previous notes and results and patient face to face interaction during today's visit.    FERNAND FREDY RAMAN, MD  08/28/2024   This document may have been prepared by [employer] Voice Recognition software and as such may include unintentional dictation errors.

## 2024-09-18 ENCOUNTER — Other Ambulatory Visit

## 2024-09-18 DIAGNOSIS — E039 Hypothyroidism, unspecified: Secondary | ICD-10-CM

## 2024-09-18 DIAGNOSIS — E119 Type 2 diabetes mellitus without complications: Secondary | ICD-10-CM

## 2024-09-18 DIAGNOSIS — E559 Vitamin D deficiency, unspecified: Secondary | ICD-10-CM

## 2024-09-18 DIAGNOSIS — E1169 Type 2 diabetes mellitus with other specified complication: Secondary | ICD-10-CM

## 2024-09-18 DIAGNOSIS — I152 Hypertension secondary to endocrine disorders: Secondary | ICD-10-CM

## 2024-09-19 ENCOUNTER — Other Ambulatory Visit

## 2024-09-20 LAB — CMP14+EGFR
ALT: 25 IU/L (ref 0–44)
AST: 21 IU/L (ref 0–40)
Albumin: 4.7 g/dL (ref 3.9–4.9)
Alkaline Phosphatase: 47 IU/L (ref 47–123)
BUN/Creatinine Ratio: 14 (ref 10–24)
BUN: 23 mg/dL (ref 8–27)
Bilirubin Total: 0.3 mg/dL (ref 0.0–1.2)
CO2: 23 mmol/L (ref 20–29)
Calcium: 11.1 mg/dL — ABNORMAL HIGH (ref 8.6–10.2)
Chloride: 96 mmol/L (ref 96–106)
Creatinine, Ser: 1.61 mg/dL — ABNORMAL HIGH (ref 0.76–1.27)
Globulin, Total: 2 g/dL (ref 1.5–4.5)
Glucose: 126 mg/dL — ABNORMAL HIGH (ref 70–99)
Potassium: 4.5 mmol/L (ref 3.5–5.2)
Sodium: 138 mmol/L (ref 134–144)
Total Protein: 6.7 g/dL (ref 6.0–8.5)
eGFR: 48 mL/min/1.73 — ABNORMAL LOW (ref >59)

## 2024-09-20 LAB — LIPID PANEL W/O CHOL/HDL RATIO
Cholesterol, Total: 116 mg/dL (ref 100–199)
HDL: 32 mg/dL — ABNORMAL LOW (ref >39)
LDL Chol Calc (NIH): 39 mg/dL (ref 0–99)
Triglycerides: 298 mg/dL — ABNORMAL HIGH (ref 0–149)
VLDL Cholesterol Cal: 45 mg/dL — ABNORMAL HIGH (ref 5–40)

## 2024-09-20 LAB — CBC WITH DIFFERENTIAL/PLATELET
Basophils Absolute: 0 x10E3/uL (ref 0.0–0.2)
Basos: 0 %
EOS (ABSOLUTE): 0.1 x10E3/uL (ref 0.0–0.4)
Eos: 1 %
Hematocrit: 46.5 % (ref 37.5–51.0)
Hemoglobin: 15.3 g/dL (ref 13.0–17.7)
Immature Grans (Abs): 0 x10E3/uL (ref 0.0–0.1)
Immature Granulocytes: 0 %
Lymphocytes Absolute: 7.1 x10E3/uL — ABNORMAL HIGH (ref 0.7–3.1)
Lymphs: 66 %
MCH: 28.9 pg (ref 26.6–33.0)
MCHC: 32.9 g/dL (ref 31.5–35.7)
MCV: 88 fL (ref 79–97)
Monocytes Absolute: 0.4 x10E3/uL (ref 0.1–0.9)
Monocytes: 4 %
Neutrophils Absolute: 3.2 x10E3/uL (ref 1.4–7.0)
Neutrophils: 29 %
Platelets: 178 x10E3/uL (ref 150–450)
RBC: 5.29 x10E6/uL (ref 4.14–5.80)
RDW: 13.2 % (ref 11.6–15.4)
WBC: 10.9 x10E3/uL — ABNORMAL HIGH (ref 3.4–10.8)

## 2024-09-20 LAB — HEMOGLOBIN A1C
Est. average glucose Bld gHb Est-mCnc: 146 mg/dL
Hgb A1c MFr Bld: 6.7 % — ABNORMAL HIGH (ref 4.8–5.6)

## 2024-09-20 LAB — TSH+T4F+T3FREE
Free T4: 1.46 ng/dL (ref 0.82–1.77)
T3, Free: 2.9 pg/mL (ref 2.0–4.4)
TSH: 2.53 u[IU]/mL (ref 0.450–4.500)

## 2024-09-20 LAB — VITAMIN D 25 HYDROXY (VIT D DEFICIENCY, FRACTURES): Vit D, 25-Hydroxy: 43.8 ng/mL (ref 30.0–100.0)

## 2024-09-22 MED ORDER — ICOSAPENT ETHYL 1 G PO CAPS: 2.0000 g | ORAL_CAPSULE | Freq: Two times a day (BID) | ORAL | 3 refills | Status: AC

## 2024-09-23 NOTE — Progress Notes (Signed)
 Patient notified

## 2024-09-25 ENCOUNTER — Encounter: Payer: Self-pay | Admitting: Internal Medicine

## 2024-09-25 ENCOUNTER — Ambulatory Visit (INDEPENDENT_AMBULATORY_CARE_PROVIDER_SITE_OTHER): Admitting: Internal Medicine

## 2024-09-25 VITALS — BP 130/74 | HR 80 | Ht 72.0 in | Wt 244.6 lb

## 2024-09-25 DIAGNOSIS — N1831 Chronic kidney disease, stage 3a: Secondary | ICD-10-CM | POA: Diagnosis not present

## 2024-09-25 DIAGNOSIS — E782 Mixed hyperlipidemia: Secondary | ICD-10-CM

## 2024-09-25 DIAGNOSIS — I152 Hypertension secondary to endocrine disorders: Secondary | ICD-10-CM

## 2024-09-25 DIAGNOSIS — C9110 Chronic lymphocytic leukemia of B-cell type not having achieved remission: Secondary | ICD-10-CM

## 2024-09-25 DIAGNOSIS — E1165 Type 2 diabetes mellitus with hyperglycemia: Secondary | ICD-10-CM | POA: Diagnosis not present

## 2024-09-25 DIAGNOSIS — Z0000 Encounter for general adult medical examination without abnormal findings: Secondary | ICD-10-CM | POA: Insufficient documentation

## 2024-09-25 DIAGNOSIS — E1159 Type 2 diabetes mellitus with other circulatory complications: Secondary | ICD-10-CM

## 2024-09-25 DIAGNOSIS — Z794 Long term (current) use of insulin: Secondary | ICD-10-CM | POA: Diagnosis not present

## 2024-09-25 DIAGNOSIS — Z0001 Encounter for general adult medical examination with abnormal findings: Secondary | ICD-10-CM | POA: Diagnosis not present

## 2024-09-25 DIAGNOSIS — E1169 Type 2 diabetes mellitus with other specified complication: Secondary | ICD-10-CM

## 2024-09-25 LAB — POCT CBG (FASTING - GLUCOSE)-MANUAL ENTRY: Glucose Fasting, POC: 165 mg/dL — AB (ref 70–99)

## 2024-09-25 MED ORDER — OZEMPIC (2 MG/DOSE) 8 MG/3ML ~~LOC~~ SOPN: 2.0000 mg | PEN_INJECTOR | SUBCUTANEOUS | 3 refills | Status: AC

## 2024-09-25 NOTE — Progress Notes (Signed)
 Established Patient Office Visit  Subjective:  Patient ID: Anthony Harvey, male    DOB: Jul 05, 1961  Age: 63 y.o. MRN: [id number]  Chief Complaint  Patient presents with   Follow-up    4 week follow up    Patient is here today for Medicare AWV. He reports feeling well today but has concerns.  He recently had his labs completed and we discussed those in detail today. He will start the Vascepa  and stop his Zeita after her finishes current prescription.  Patient reports fasting blood sugars on average 110-140s. He endorses hypoglycemia events on occasion into 60's before lunch he states he has cut back his portion sizes and does not normally eat snacks. Recommend patient to take 1/2 tablet of glipizide  in the morning to reduce dose to glipizide  2.5 mg daily. Will increase once weekly Ozempic  to 2 mg dose after he finishes his current supply of the Ozempic  1 mg weekly injection. He denies any abdominal distress from the Ozempic .  His last colonoscopy was 2024. 6CIT was 0 today.    No other concerns at this time.   Past Medical History:  Diagnosis Date   Arthritis    Diabetes mellitus without complication (HCC)    GERD (gastroesophageal reflux disease)    Hx of back injury 2010   fell from piping and hit back   Hyperlipidemia    Hypertension    Iron deficiency anemia    Sciatica of left side    Vertigo    rare    Past Surgical History:  Procedure Laterality Date   COLONOSCOPY WITH PROPOFOL  N/A 09/10/2023   Procedure: COLONOSCOPY WITH PROPOFOL ;  Surgeon: Jinny Carmine, MD;  Location: [facility];  Service: Endoscopy;  Laterality: N/A;   ESOPHAGOGASTRODUODENOSCOPY (EGD) WITH PROPOFOL  N/A 09/10/2023   Procedure: ESOPHAGOGASTRODUODENOSCOPY (EGD) WITH PROPOFOL ;  Surgeon: Jinny Carmine, MD;  Location: [facility];  Service: Endoscopy;  Laterality: N/A;  Diabetic   GIVENS CAPSULE STUDY N/A 09/26/2023   Procedure: GIVENS CAPSULE STUDY;  Surgeon: Jinny Carmine, MD;   Location: [facility] END[facility];  Service: Endoscopy;  Laterality: N/A;   HERNIA REPAIR  08/03/2008   Right direct inguinal hernia, large Ultra Pro mesh.   KNEE ARTHROSCOPY Bilateral    KNEE ARTHROSCOPY Right 11/16/2016   Procedure: ARTHROSCOPY KNEE, lateral release, partial meniscectomy;  Surgeon: Ozell Flake, MD;  Location: [facility] ORS;  Service: Orthopedics;  Laterality: Right;   POLYPECTOMY  09/10/2023   Procedure: POLYPECTOMY;  Surgeon: Jinny Carmine, MD;  Location: [facility];  Service: Endoscopy;;   TONSILLECTOMY  as child    Social History   Socioeconomic History   Marital status: Married    Spouse name: Not on file   Number of children: Not on file   Years of education: Not on file   Highest education level: Not on file  Occupational History   Not on file  Tobacco Use   Smoking status: Never   Smokeless tobacco: Never  Vaping Use   Vaping status: Never Used  Substance and Sexual Activity   Alcohol use: No    Alcohol/week: 0.0 standard drinks of alcohol   Drug use: No   Sexual activity: Not on file  Other Topics Concern   Not on file  Social History Narrative   Not on file   Social Drivers of Health   Tobacco Use: Low Risk (09/25/2024)   Patient History    Smoking Tobacco Use: Never    Smokeless Tobacco Use: Never  Passive Exposure: Not on file  Financial Resource Strain: Low Risk (05/06/2024)   Received from [facility]   Overall Financial Resource Strain (CARDIA)    How hard is it for you to pay for the very basics like food, housing, medical care, and heating?: Not hard at all  Food Insecurity: No Food Insecurity (05/06/2024)   Received from [facility]   Epic    Within the past 12 months, you worried that your food would run out before you got the money to buy more.: Never true    Within the past 12 months, the food you bought just didn't last and you didn't have money to get more.: Never true  Transportation Needs: No Transportation Needs (05/06/2024)    Received from [facility]    In the past 12 months, has lack of transportation kept you from medical appointments or from getting medications?: No    In the past 12 months, has lack of transportation kept you from meetings, work, or from getting things needed for daily living?: No  Physical Activity: Not on file  Stress: Not on file  Social Connections: Not on file  Intimate Partner Violence: Not At Risk (01/01/2024)   Humiliation, Afraid, Rape, and Kick questionnaire    Fear of Current or Ex-Partner: No    Emotionally Abused: No    Physically Abused: No    Sexually Abused: No  Depression (PHQ2-9): Low Risk (01/01/2024)   Depression (PHQ2-9)    PHQ-2 Score: 0  Alcohol Screen: Not on file  Housing: Low Risk (05/06/2024)   Received from [facility]    In the last 12 months, was there a time when you were not able to pay the mortgage or rent on time?: No    In the past 12 months, how many times have you moved where you were living?: 0    At any time in the past 12 months, were you homeless or living in a shelter (including now)?: No  Utilities: Not At Risk (05/06/2024)   Received from [facility]    In the past 12 months has the electric, gas, oil, or water  company threatened to shut off services in your home?: No  Health Literacy: Not on file    Family History  Problem Relation Age of Onset   Cancer Mother    Diabetes Mother    Cancer Father     Allergies[1]  Show/hide medication list[2]  Review of Systems  Constitutional: Negative.  Negative for chills, fever and malaise/fatigue.  HENT: Negative.  Negative for congestion and sore throat.   Eyes: Negative.  Negative for blurred vision and pain.  Respiratory: Negative.  Negative for cough and shortness of breath.   Cardiovascular: Negative.  Negative for chest pain, palpitations and leg swelling.  Gastrointestinal: Negative.  Negative for abdominal pain, blood in stool, constipation, diarrhea,  heartburn, melena, nausea and vomiting.  Genitourinary: Negative.  Negative for dysuria, flank pain, frequency and urgency.  Musculoskeletal: Negative.  Negative for joint pain and myalgias.  Skin: Negative.   Neurological: Negative.  Negative for dizziness, tingling, sensory change, weakness and headaches.  Endo/Heme/Allergies: Negative.   Psychiatric/Behavioral: Negative.  Negative for depression and suicidal ideas. The patient is not nervous/anxious.        Objective:   BP 130/74   Pulse 80   Ht 6' (1.829 m)   Wt 244 lb 9.6 oz (110.9 kg)   SpO2 97%   BMI  33.17 kg/m   Vitals:   09/25/24 0935  BP: 130/74  Pulse: 80  Height: 6' (1.829 m)  Weight: 244 lb 9.6 oz (110.9 kg)  SpO2: 97%  BMI (Calculated): 33.17    Physical Exam Vitals and nursing note reviewed.  Constitutional:      General: He is not in acute distress.    Appearance: Normal appearance. He is not ill-appearing.  HENT:     Head: Normocephalic and atraumatic.     Nose: Nose normal.     Mouth/Throat:     Mouth: Mucous membranes are moist.     Pharynx: Oropharynx is clear.  Eyes:     Conjunctiva/sclera: Conjunctivae normal.     Pupils: Pupils are equal, round, and reactive to light.  Cardiovascular:     Rate and Rhythm: Normal rate and regular rhythm.     Pulses: Normal pulses.     Heart sounds: Normal heart sounds.  Pulmonary:     Effort: Pulmonary effort is normal.     Breath sounds: Normal breath sounds. No wheezing or rhonchi.  Abdominal:     General: Bowel sounds are normal. There is no distension.     Palpations: Abdomen is soft.     Tenderness: There is no abdominal tenderness.  Musculoskeletal:        General: Normal range of motion.     Cervical back: Normal range of motion and neck supple.     Right lower leg: No edema.     Left lower leg: No edema.  Skin:    General: Skin is warm and dry.     Capillary Refill: Capillary refill takes less than 2 seconds.  Neurological:     General: No  focal deficit present.     Mental Status: He is alert and oriented to person, place, and time.     Sensory: No sensory deficit.     Motor: No weakness.  Psychiatric:        Mood and Affect: Mood normal.        Behavior: Behavior normal.        Judgment: Judgment normal.      Results for orders placed or performed in visit on 09/25/24  POCT CBG (Fasting - Glucose)  Result Value Ref Range   Glucose Fasting, POC 165 (A) 70 - 99 mg/dL    Recent Results (from the past 2160 hours)  CBC with Differential (Cancer Center Only)     Status: Abnormal   Collection Time: 07/29/24 11:15 AM  Result Value Ref Range   WBC Count 11.4 (H) 4.0 - 10.5 K/uL   RBC 5.32 4.22 - 5.81 MIL/uL   Hemoglobin 15.3 13.0 - 17.0 g/dL   HCT 55.4 60.9 - 47.9 %   MCV 83.6 80.0 - 100.0 fL   MCH 28.8 26.0 - 34.0 pg   MCHC 34.4 30.0 - 36.0 g/dL   RDW 86.7 88.4 - 84.4 %   Platelet Count 190 150 - 400 K/uL   nRBC 0.0 0.0 - 0.2 %   Neutrophils Relative % 31 %   Neutro Abs 3.6 1.7 - 7.7 K/uL   Lymphocytes Relative 63 %   Lymphs Abs 7.1 (H) 0.7 - 4.0 K/uL   Monocytes Relative 5 %   Monocytes Absolute 0.5 0.1 - 1.0 K/uL   Eosinophils Relative 1 %   Eosinophils Absolute 0.1 0.0 - 0.5 K/uL   Basophils Relative 0 %   Basophils Absolute 0.0 0.0 - 0.1 K/uL   WBC Morphology See  Note     Comment: Lymphocytosis with morphology consistent with known diagnosis of CLL. SMUDGE CELLS    RBC Morphology MORPHOLOGY UNREMARKABLE    Smear Review Normal platelet morphology    Immature Granulocytes 0 %   Abs Immature Granulocytes 0.03 0.00 - 0.07 K/uL    Comment: Performed at [facility], [address] [place], [place] [address]  POCT CBG (Fasting - Glucose)     Status: Abnormal   Collection Time: 07/31/24  9:59 AM  Result Value Ref Range   Glucose Fasting, POC 141 (A) 70 - 99 mg/dL  POCT CBG (Fasting - Glucose)     Status: Abnormal   Collection Time: 08/28/24  9:44 AM  Result Value Ref Range   Glucose Fasting, POC  117 (A) 70 - 99 mg/dL  [id number]     Status: None   Collection Time: 09/19/24  9:35 AM  Result Value Ref Range   TSH 2.530 0.450 - 4.500 uIU/mL   T3, Free 2.9 2.0 - 4.4 pg/mL   Free T4 1.46 0.82 - 1.77 ng/dL  Hemoglobin J8r     Status: Abnormal   Collection Time: 09/19/24  9:35 AM  Result Value Ref Range   Hgb A1c MFr Bld 6.7 (H) 4.8 - 5.6 %    Comment:          Prediabetes: 5.7 - 6.4          Diabetes: >6.4          Glycemic control for adults with diabetes: <7.0    Est. average glucose Bld gHb Est-mCnc 146 mg/dL  CBC with Diff     Status: Abnormal   Collection Time: 09/19/24  9:35 AM  Result Value Ref Range   WBC 10.9 (H) 3.4 - 10.8 x10E3/uL   RBC 5.29 4.14 - 5.80 x10E6/uL   Hemoglobin 15.3 13.0 - 17.7 g/dL   Hematocrit 53.4 62.4 - 51.0 %   MCV 88 79 - 97 fL   MCH 28.9 26.6 - 33.0 pg   MCHC 32.9 31.5 - 35.7 g/dL   RDW 86.7 88.3 - 84.5 %   Platelets 178 150 - 450 x10E3/uL   Neutrophils 29 Not Estab. %   Lymphs 66 Not Estab. %   Monocytes 4 Not Estab. %   Eos 1 Not Estab. %   Basos 0 Not Estab. %   Neutrophils Absolute 3.2 1.4 - 7.0 x10E3/uL   Lymphocytes Absolute 7.1 (H) 0.7 - 3.1 x10E3/uL   Monocytes Absolute 0.4 0.1 - 0.9 x10E3/uL   EOS (ABSOLUTE) 0.1 0.0 - 0.4 x10E3/uL   Basophils Absolute 0.0 0.0 - 0.2 x10E3/uL   Immature Granulocytes 0 Not Estab. %   Immature Grans (Abs) 0.0 0.0 - 0.1 x10E3/uL  Vitamin D  (25 hydroxy)     Status: None   Collection Time: 09/19/24  9:35 AM  Result Value Ref Range   Vit D, 25-Hydroxy 43.8 30.0 - 100.0 ng/mL    Comment: Vitamin D  deficiency has been defined by the Institute of Medicine and an Endocrine Society practice guideline as a level of serum 25-OH vitamin D  less than 20 ng/mL (1,2). The Endocrine Society went on to further define vitamin D  insufficiency as a level between 21 and 29 ng/mL (2). 1. IOM (Institute of Medicine). 2010. Dietary reference    intakes for calcium  and D. [place]: The    [employer]. 2. Holick MF, Binkley Ramblewood, Bischoff-Ferrari HA, et al.    Evaluation, treatment, and prevention of  vitamin D     deficiency: an Endocrine Society clinical practice    guideline. JCEM. 2011 Jul; 96(7):1911-30.   Lipid Panel w/o Chol/HDL Ratio     Status: Abnormal   Collection Time: 09/19/24  9:35 AM  Result Value Ref Range   Cholesterol, Total 116 100 - 199 mg/dL   Triglycerides 701 (H) 0 - 149 mg/dL   HDL 32 (L) >60 mg/dL   VLDL Cholesterol Cal 45 (H) 5 - 40 mg/dL   LDL Chol Calc (NIH) 39 0 - 99 mg/dL  [id number]     Status: Abnormal   Collection Time: 09/19/24  9:35 AM  Result Value Ref Range   Glucose 126 (H) 70 - 99 mg/dL   BUN 23 8 - 27 mg/dL   Creatinine, Ser 8.38 (H) 0.76 - 1.27 mg/dL   eGFR 48 (L) >40 fO/fpw/8.26   BUN/Creatinine Ratio 14 10 - 24   Sodium 138 134 - 144 mmol/L   Potassium 4.5 3.5 - 5.2 mmol/L   Chloride 96 96 - 106 mmol/L   CO2 23 20 - 29 mmol/L   Calcium  11.1 (H) 8.6 - 10.2 mg/dL   Total Protein 6.7 6.0 - 8.5 g/dL   Albumin 4.7 3.9 - 4.9 g/dL   Globulin, Total 2.0 1.5 - 4.5 g/dL   Bilirubin Total 0.3 0.0 - 1.2 mg/dL   Alkaline Phosphatase 47 47 - 123 IU/L   AST 21 0 - 40 IU/L   ALT 25 0 - 44 IU/L  POCT CBG (Fasting - Glucose)     Status: Abnormal   Collection Time: 09/25/24  9:40 AM  Result Value Ref Range   Glucose Fasting, POC 165 (A) 70 - 99 mg/dL      Assessment & Plan:  Reduce glipizide  to 1/2 tablet each morning. Increase Ozempic  to 2 mg weekly injection. Start Vascepa  when it arrives. Stop Zetia  after finishing current prescription. Continue other medications as prescribed. Reinforced healthy diet and exercise as tolerated. Problem List Items Addressed This Visit       Cardiovascular and Mediastinum   Hypertension associated with diabetes (HCC)   Relevant Medications   Semaglutide , 2 MG/DOSE, (OZEMPIC , 2 MG/DOSE,) 8 MG/3ML SOPN     Endocrine   Combined hyperlipidemia associated with type 2 diabetes mellitus (HCC)   Relevant  Medications   Semaglutide , 2 MG/DOSE, (OZEMPIC , 2 MG/DOSE,) 8 MG/3ML SOPN   Type 2 diabetes mellitus with hyperglycemia, with long-term current use of insulin  (HCC)   Relevant Medications   Semaglutide , 2 MG/DOSE, (OZEMPIC , 2 MG/DOSE,) 8 MG/3ML SOPN   Other Relevant Orders   POCT CBG (Fasting - Glucose) (Completed)     Genitourinary   CKD stage 3a, GFR 45-59 ml/min (HCC)     Other   CLL (chronic lymphocytic leukemia) (HCC)   Medicare annual wellness visit, subsequent - Primary    Return in about 3 months (around 12/24/2024).   Total time spent: 30 minutes. This time includes review of previous notes and results and patient face to face interaction during today's visit.    FERNAND FREDY RAMAN, MD  09/25/2024   This document may have been prepared by [facility] Voice Recognition software and as such may include unintentional dictation errors.     [1]  Allergies Allergen Reactions   Cymbalta [Duloxetine Hcl] Other (See Comments)    Chest pain   Duloxetine Other (See Comments)   Pregabalin     Other reaction(s): Vertigo, speech problems   Celebrex [Celecoxib] Palpitations   Niacin Hives and Rash  [  2]  Outpatient Medications Prior to Visit  Medication Sig   amLODipine  (NORVASC ) 5 MG tablet Take 1 tablet (5 mg total) by mouth daily.   aspirin  EC 81 MG tablet Take 81 mg by mouth at bedtime.   cyclobenzaprine  (FLEXERIL ) 5 MG tablet Take 5 mg by mouth once.   docusate sodium  (COLACE) 100 MG capsule Take 100 mg by mouth at bedtime. Twice a day   FARXIGA  10 MG TABS tablet Take 1 tablet (10 mg total) by mouth daily.   fenofibrate  (TRICOR ) 145 MG tablet Take 1 tablet (145 mg total) by mouth daily.   glipiZIDE  (GLUCOTROL  XL) 5 MG 24 hr tablet Take 1 tablet (5 mg total) by mouth daily.   hydrALAZINE  (APRESOLINE ) 50 MG tablet Take 1 tablet (50 mg total) by mouth 2 (two) times daily.   insulin  glargine, 1 Unit Dial, (TOUJEO  SOLOSTAR) 300 UNIT/ML Solostar Pen Inject 30 Units into the skin  daily.   lisinopril  (ZESTRIL ) 20 MG tablet Take 1 tablet (20 mg total) by mouth daily. just at bedtime.   lisinopril -hydrochlorothiazide  (ZESTORETIC ) 20-25 MG tablet Take 1 tablet by mouth daily. In the morning.   metFORMIN  (GLUCOPHAGE ) 1000 MG tablet Take 1 tablet (1,000 mg total) by mouth 2 (two) times daily with a meal.   metoprolol  succinate (TOPROL -XL) 50 MG 24 hr tablet Take 1 tablet (50 mg total) by mouth daily.   Multiple Vitamins-Minerals (MULTIVITAMIN ADULTS 50+ PO) Take by mouth daily.   OVER THE COUNTER MEDICATION Sciatic Ease, b12   pantoprazole  (PROTONIX ) 40 MG tablet Take 1 tablet (40 mg total) by mouth daily.   rosuvastatin  (CRESTOR ) 5 MG tablet Take 1 tablet (5 mg total) by mouth daily.   traMADol  (ULTRAM ) 50 MG tablet Take 50 mg by mouth every 6 (six) hours as needed (pain).  (Patient taking differently: Take 50 mg by mouth every 12 (twelve) hours as needed (pain).)   traMADol  (ULTRAM -ER) 300 MG 24 hr tablet Take 300 mg by mouth daily. Pt takes 300mg  in the morning   Turmeric 500 MG TABS Take by mouth daily.   [DISCONTINUED] OZEMPIC , 1 MG/DOSE, 4 MG/3ML SOPN INJECT SUBCUTANEOUSLY 1 MG EVERY WEEK   icosapent  Ethyl (VASCEPA ) 1 g capsule Take 2 capsules (2 g total) by mouth 2 (two) times daily. (Patient not taking: Reported on 09/25/2024)   [DISCONTINUED] cyanocobalamin  (VITAMIN B12) 1000 MCG tablet Take 1,000 mcg by mouth daily. (Patient not taking: Reported on 09/25/2024)   [DISCONTINUED] ergocalciferol  (VITAMIN D2) 1.25 MG (50000 UT) capsule Take 50,000 Units by mouth once a week. Saturday (Patient not taking: Reported on 09/25/2024)   [DISCONTINUED] ezetimibe  (ZETIA ) 10 MG tablet Take 1 tablet (10 mg total) by mouth daily. (Patient not taking: Reported on 09/25/2024)   No facility-administered medications prior to visit.

## 2024-10-31 ENCOUNTER — Encounter: Payer: Self-pay | Admitting: Internal Medicine

## 2024-10-31 ENCOUNTER — Ambulatory Visit: Admitting: Internal Medicine

## 2024-10-31 VITALS — BP 118/70 | HR 72 | Ht 72.0 in | Wt 244.0 lb

## 2024-10-31 DIAGNOSIS — Z794 Long term (current) use of insulin: Secondary | ICD-10-CM | POA: Diagnosis not present

## 2024-10-31 DIAGNOSIS — E66811 Obesity, class 1: Secondary | ICD-10-CM | POA: Diagnosis not present

## 2024-10-31 DIAGNOSIS — E6609 Other obesity due to excess calories: Secondary | ICD-10-CM

## 2024-10-31 DIAGNOSIS — I152 Hypertension secondary to endocrine disorders: Secondary | ICD-10-CM

## 2024-10-31 DIAGNOSIS — E782 Mixed hyperlipidemia: Secondary | ICD-10-CM | POA: Diagnosis not present

## 2024-10-31 DIAGNOSIS — R21 Rash and other nonspecific skin eruption: Secondary | ICD-10-CM | POA: Diagnosis not present

## 2024-10-31 DIAGNOSIS — Z6833 Body mass index (BMI) 33.0-33.9, adult: Secondary | ICD-10-CM

## 2024-10-31 DIAGNOSIS — E559 Vitamin D deficiency, unspecified: Secondary | ICD-10-CM

## 2024-10-31 DIAGNOSIS — E1159 Type 2 diabetes mellitus with other circulatory complications: Secondary | ICD-10-CM

## 2024-10-31 DIAGNOSIS — E119 Type 2 diabetes mellitus without complications: Secondary | ICD-10-CM

## 2024-10-31 DIAGNOSIS — E1169 Type 2 diabetes mellitus with other specified complication: Secondary | ICD-10-CM | POA: Diagnosis not present

## 2024-10-31 DIAGNOSIS — E039 Hypothyroidism, unspecified: Secondary | ICD-10-CM | POA: Diagnosis not present

## 2024-10-31 DIAGNOSIS — E1165 Type 2 diabetes mellitus with hyperglycemia: Secondary | ICD-10-CM

## 2024-10-31 NOTE — Progress Notes (Signed)
 "  Established Patient Office Visit  Subjective:  Patient ID: Anthony Harvey, male    DOB: October 27, 1960  Age: 64 y.o. MRN: [id number]  Chief Complaint  Patient presents with   Follow-up    Discuss referral to dermatology    Patient is here today for Dermatology referral. He reports his insurance now required referral to Dr. Arlyss in [place]. He has been seeing this provider for years and has upcoming appointment 11/2024. Will send Dermatology referral for patient.  Other wise he states he is doing well. He is not due for labs at this time. He reports taking his medications as prescribed without side effects. Patient is not due for labs at this time. Will order future labs to be completed prior to his next appointment. Patient requesting having same day visit as his wife if possible.     No other concerns at this time.   Past Medical History:  Diagnosis Date   Arthritis    Diabetes mellitus without complication (HCC)    GERD (gastroesophageal reflux disease)    Hx of back injury 2010   fell from piping and hit back   Hyperlipidemia    Hypertension    Iron deficiency anemia    Sciatica of left side    Vertigo    rare    Past Surgical History:  Procedure Laterality Date   COLONOSCOPY WITH PROPOFOL  N/A 09/10/2023   Procedure: COLONOSCOPY WITH PROPOFOL ;  Surgeon: Jinny Carmine, MD;  Location: [facility];  Service: Endoscopy;  Laterality: N/A;   ESOPHAGOGASTRODUODENOSCOPY (EGD) WITH PROPOFOL  N/A 09/10/2023   Procedure: ESOPHAGOGASTRODUODENOSCOPY (EGD) WITH PROPOFOL ;  Surgeon: Jinny Carmine, MD;  Location: [facility];  Service: Endoscopy;  Laterality: N/A;  Diabetic   GIVENS CAPSULE STUDY N/A 09/26/2023   Procedure: GIVENS CAPSULE STUDY;  Surgeon: Jinny Carmine, MD;  Location: [facility] END[facility];  Service: Endoscopy;  Laterality: N/A;   HERNIA REPAIR  08/03/2008   Right direct inguinal hernia, large Ultra Pro mesh.   KNEE ARTHROSCOPY Bilateral    KNEE ARTHROSCOPY  Right 11/16/2016   Procedure: ARTHROSCOPY KNEE, lateral release, partial meniscectomy;  Surgeon: Ozell Flake, MD;  Location: [facility] ORS;  Service: Orthopedics;  Laterality: Right;   POLYPECTOMY  09/10/2023   Procedure: POLYPECTOMY;  Surgeon: Jinny Carmine, MD;  Location: [facility];  Service: Endoscopy;;   TONSILLECTOMY  as child    Social History   Socioeconomic History   Marital status: Married    Spouse name: Not on file   Number of children: Not on file   Years of education: Not on file   Highest education level: Not on file  Occupational History   Not on file  Tobacco Use   Smoking status: Never   Smokeless tobacco: Never  Vaping Use   Vaping status: Never Used  Substance and Sexual Activity   Alcohol use: No    Alcohol/week: 0.0 standard drinks of alcohol   Drug use: No   Sexual activity: Not on file  Other Topics Concern   Not on file  Social History Narrative   Not on file   Social Drivers of Health   Tobacco Use: Low Risk (10/31/2024)   Patient History    Smoking Tobacco Use: Never    Smokeless Tobacco Use: Never    Passive Exposure: Not on file  Financial Resource Strain: Low Risk (05/06/2024)   Received from [facility]   Overall Financial Resource Strain (CARDIA)    How hard is it for you to pay  for the very basics like food, housing, medical care, and heating?: Not hard at all  Food Insecurity: No Food Insecurity (05/06/2024)   Received from [facility]   Epic    Within the past 12 months, you worried that your food would run out before you got the money to buy more.: Never true    Within the past 12 months, the food you bought just didn't last and you didn't have money to get more.: Never true  Transportation Needs: No Transportation Needs (05/06/2024)   Received from [facility]    In the past 12 months, has lack of transportation kept you from medical appointments or from getting medications?: No    In the past 12 months, has lack of  transportation kept you from meetings, work, or from getting things needed for daily living?: No  Physical Activity: Not on file  Stress: Not on file  Social Connections: Not on file  Intimate Partner Violence: Not At Risk (01/01/2024)   Humiliation, Afraid, Rape, and Kick questionnaire    Fear of Current or Ex-Partner: No    Emotionally Abused: No    Physically Abused: No    Sexually Abused: No  Depression (PHQ2-9): Low Risk (01/01/2024)   Depression (PHQ2-9)    PHQ-2 Score: 0  Alcohol Screen: Not on file  Housing: Low Risk (05/06/2024)   Received from [facility]    In the last 12 months, was there a time when you were not able to pay the mortgage or rent on time?: No    In the past 12 months, how many times have you moved where you were living?: 0    At any time in the past 12 months, were you homeless or living in a shelter (including now)?: No  Utilities: Not At Risk (05/06/2024)   Received from [facility]    In the past 12 months has the electric, gas, oil, or water  company threatened to shut off services in your home?: No  Health Literacy: Not on file    Family History  Problem Relation Age of Onset   Cancer Mother    Diabetes Mother    Cancer Father     Allergies[1]  Show/hide medication list[2]  Review of Systems  Constitutional: Negative.  Negative for chills, fever and malaise/fatigue.  HENT: Negative.  Negative for congestion and sore throat.   Eyes: Negative.  Negative for blurred vision and pain.  Respiratory: Negative.  Negative for cough and shortness of breath.   Cardiovascular: Negative.  Negative for chest pain, palpitations and leg swelling.  Gastrointestinal: Negative.  Negative for abdominal pain, blood in stool, constipation, diarrhea, heartburn, melena, nausea and vomiting.  Genitourinary: Negative.  Negative for dysuria, flank pain, frequency and urgency.  Musculoskeletal: Negative.  Negative for joint pain and myalgias.  Skin:   Positive for rash.  Neurological: Negative.  Negative for dizziness, tingling, sensory change, weakness and headaches.  Endo/Heme/Allergies: Negative.   Psychiatric/Behavioral: Negative.  Negative for depression and suicidal ideas. The patient is not nervous/anxious.        Objective:   BP 118/70   Pulse 72   Ht 6' (1.829 m)   Wt 244 lb (110.7 kg)   SpO2 97%   BMI 33.09 kg/m   Vitals:   10/31/24 0942  BP: 118/70  Pulse: 72  Height: 6' (1.829 m)  Weight: 244 lb (110.7 kg)  SpO2: 97%  BMI (Calculated): 33.09  Physical Exam Vitals and nursing note reviewed.  Constitutional:      General: He is not in acute distress.    Appearance: Normal appearance. He is not ill-appearing.  HENT:     Head: Normocephalic and atraumatic.     Nose: Nose normal.     Mouth/Throat:     Mouth: Mucous membranes are moist.     Pharynx: Oropharynx is clear.  Eyes:     Conjunctiva/sclera: Conjunctivae normal.     Pupils: Pupils are equal, round, and reactive to light.  Cardiovascular:     Rate and Rhythm: Normal rate and regular rhythm.     Pulses: Normal pulses.     Heart sounds: Normal heart sounds.  Pulmonary:     Effort: Pulmonary effort is normal.     Breath sounds: Normal breath sounds. No wheezing or rhonchi.  Abdominal:     General: Bowel sounds are normal. There is no distension.     Palpations: Abdomen is soft.     Tenderness: There is no abdominal tenderness.  Musculoskeletal:        General: Normal range of motion.     Cervical back: Normal range of motion and neck supple.     Right lower leg: No edema.     Left lower leg: No edema.  Skin:    General: Skin is warm and dry.     Capillary Refill: Capillary refill takes less than 2 seconds.     Findings: Rash present. Rash is scaling.     Comments: Rash on back of both hands and back  Neurological:     General: No focal deficit present.     Mental Status: He is alert and oriented to person, place, and time.     Sensory:  No sensory deficit.     Motor: No weakness.  Psychiatric:        Mood and Affect: Mood normal.        Behavior: Behavior normal.        Judgment: Judgment normal.      No results found for any visits on 10/31/24.  Recent Results (from the past 2160 hours)  POCT CBG (Fasting - Glucose)     Status: Abnormal   Collection Time: 08/28/24  9:44 AM  Result Value Ref Range   Glucose Fasting, POC 117 (A) 70 - 99 mg/dL  [id number]     Status: None   Collection Time: 09/19/24  9:35 AM  Result Value Ref Range   TSH 2.530 0.450 - 4.500 uIU/mL   T3, Free 2.9 2.0 - 4.4 pg/mL   Free T4 1.46 0.82 - 1.77 ng/dL  Hemoglobin J8r     Status: Abnormal   Collection Time: 09/19/24  9:35 AM  Result Value Ref Range   Hgb A1c MFr Bld 6.7 (H) 4.8 - 5.6 %    Comment:          Prediabetes: 5.7 - 6.4          Diabetes: >6.4          Glycemic control for adults with diabetes: <7.0    Est. average glucose Bld gHb Est-mCnc 146 mg/dL  CBC with Diff     Status: Abnormal   Collection Time: 09/19/24  9:35 AM  Result Value Ref Range   WBC 10.9 (H) 3.4 - 10.8 x10E3/uL   RBC 5.29 4.14 - 5.80 x10E6/uL   Hemoglobin 15.3 13.0 - 17.7 g/dL   Hematocrit 53.4 62.4 - 51.0 %   MCV  88 79 - 97 fL   MCH 28.9 26.6 - 33.0 pg   MCHC 32.9 31.5 - 35.7 g/dL   RDW 86.7 88.3 - 84.5 %   Platelets 178 150 - 450 x10E3/uL   Neutrophils 29 Not Estab. %   Lymphs 66 Not Estab. %   Monocytes 4 Not Estab. %   Eos 1 Not Estab. %   Basos 0 Not Estab. %   Neutrophils Absolute 3.2 1.4 - 7.0 x10E3/uL   Lymphocytes Absolute 7.1 (H) 0.7 - 3.1 x10E3/uL   Monocytes Absolute 0.4 0.1 - 0.9 x10E3/uL   EOS (ABSOLUTE) 0.1 0.0 - 0.4 x10E3/uL   Basophils Absolute 0.0 0.0 - 0.2 x10E3/uL   Immature Granulocytes 0 Not Estab. %   Immature Grans (Abs) 0.0 0.0 - 0.1 x10E3/uL  Vitamin D  (25 hydroxy)     Status: None   Collection Time: 09/19/24  9:35 AM  Result Value Ref Range   Vit D, 25-Hydroxy 43.8 30.0 - 100.0 ng/mL    Comment: Vitamin D   deficiency has been defined by the Institute of Medicine and an Endocrine Society practice guideline as a level of serum 25-OH vitamin D  less than 20 ng/mL (1,2). The Endocrine Society went on to further define vitamin D  insufficiency as a level between 21 and 29 ng/mL (2). 1. IOM (Institute of Medicine). 2010. Dietary reference    intakes for calcium  and D. [place]: The    [employer]. 2. Holick MF, Binkley [place], Bischoff-Ferrari HA, et al.    Evaluation, treatment, and prevention of vitamin D     deficiency: an Endocrine Society clinical practice    guideline. JCEM. 2011 Jul; 96(7):1911-30.   Lipid Panel w/o Chol/HDL Ratio     Status: Abnormal   Collection Time: 09/19/24  9:35 AM  Result Value Ref Range   Cholesterol, Total 116 100 - 199 mg/dL   Triglycerides 701 (H) 0 - 149 mg/dL   HDL 32 (L) >60 mg/dL   VLDL Cholesterol Cal 45 (H) 5 - 40 mg/dL   LDL Chol Calc (NIH) 39 0 - 99 mg/dL  [id number]     Status: Abnormal   Collection Time: 09/19/24  9:35 AM  Result Value Ref Range   Glucose 126 (H) 70 - 99 mg/dL   BUN 23 8 - 27 mg/dL   Creatinine, Ser 8.38 (H) 0.76 - 1.27 mg/dL   eGFR 48 (L) >40 fO/fpw/8.26   BUN/Creatinine Ratio 14 10 - 24   Sodium 138 134 - 144 mmol/L   Potassium 4.5 3.5 - 5.2 mmol/L   Chloride 96 96 - 106 mmol/L   CO2 23 20 - 29 mmol/L   Calcium  11.1 (H) 8.6 - 10.2 mg/dL   Total Protein 6.7 6.0 - 8.5 g/dL   Albumin 4.7 3.9 - 4.9 g/dL   Globulin, Total 2.0 1.5 - 4.5 g/dL   Bilirubin Total 0.3 0.0 - 1.2 mg/dL   Alkaline Phosphatase 47 47 - 123 IU/L   AST 21 0 - 40 IU/L   ALT 25 0 - 44 IU/L  POCT CBG (Fasting - Glucose)     Status: Abnormal   Collection Time: 09/25/24  9:40 AM  Result Value Ref Range   Glucose Fasting, POC 165 (A) 70 - 99 mg/dL      Assessment & Plan:  Dermatology referral sent. Future labs ordered to be collected fasting prior to next appointment. Will move patients appointment out to 01/2025 so he can come the same day as  his wifes  next appointment. Continue taking medications as prescribed. Reinforced healthy diet and exercise as tolerated. Problem List Items Addressed This Visit       Cardiovascular and Mediastinum   Hypertension associated with diabetes (HCC) - Primary   Relevant Orders   CMP14+EGFR   CBC with Diff     Endocrine   Type 2 diabetes mellitus without complication, with long-term current use of insulin  (HCC)   Relevant Orders   Hemoglobin A1c   Combined hyperlipidemia associated with type 2 diabetes mellitus (HCC)   Relevant Orders   Lipid Panel w/o Chol/HDL Ratio   Hypothyroidism   Relevant Orders   TSH+T4F+T3Free     Musculoskeletal and Integument   Skin rash   Relevant Orders   Ambulatory referral to Dermatology     Other   Vitamin D  deficiency   Relevant Orders   Vitamin D  (25 hydroxy)   Class 1 obesity due to excess calories with serious comorbidity and body mass index (BMI) of 33.0 to 33.9 in adult    Return in about 3 months (around 01/29/2025).   Total time spent: 25 minutes. This time includes review of previous notes and results and patient face to face interaction during today's visit.    Anthony FREDY RAMAN, MD  10/31/2024   This document may have been prepared by [facility] Voice Recognition software and as such may include unintentional dictation errors.     [1]  Allergies Allergen Reactions   Cymbalta [Duloxetine Hcl] Other (See Comments)    Chest pain   Duloxetine Other (See Comments)   Pregabalin     Other reaction(s): Vertigo, speech problems   Celebrex [Celecoxib] Palpitations   Niacin Hives and Rash  [2]  Outpatient Medications Prior to Visit  Medication Sig   amLODipine  (NORVASC ) 5 MG tablet Take 1 tablet (5 mg total) by mouth daily.   aspirin  EC 81 MG tablet Take 81 mg by mouth at bedtime.   cyclobenzaprine  (FLEXERIL ) 5 MG tablet Take 5 mg by mouth once.   docusate sodium  (COLACE) 100 MG capsule Take 100 mg by mouth at bedtime. Twice a day    FARXIGA  10 MG TABS tablet Take 1 tablet (10 mg total) by mouth daily.   fenofibrate  (TRICOR ) 145 MG tablet Take 1 tablet (145 mg total) by mouth daily.   glipiZIDE  (GLUCOTROL  XL) 5 MG 24 hr tablet Take 1 tablet (5 mg total) by mouth daily.   hydrALAZINE  (APRESOLINE ) 50 MG tablet Take 1 tablet (50 mg total) by mouth 2 (two) times daily.   insulin  glargine, 1 Unit Dial, (TOUJEO  SOLOSTAR) 300 UNIT/ML Solostar Pen Inject 30 Units into the skin daily.   lisinopril  (ZESTRIL ) 20 MG tablet Take 1 tablet (20 mg total) by mouth daily. just at bedtime.   lisinopril -hydrochlorothiazide  (ZESTORETIC ) 20-25 MG tablet Take 1 tablet by mouth daily. In the morning.   metFORMIN  (GLUCOPHAGE ) 1000 MG tablet Take 1 tablet (1,000 mg total) by mouth 2 (two) times daily with a meal.   metoprolol  succinate (TOPROL -XL) 50 MG 24 hr tablet Take 1 tablet (50 mg total) by mouth daily.   Multiple Vitamins-Minerals (MULTIVITAMIN ADULTS 50+ PO) Take by mouth daily.   OVER THE COUNTER MEDICATION Sciatic Ease, b12   pantoprazole  (PROTONIX ) 40 MG tablet Take 1 tablet (40 mg total) by mouth daily.   rosuvastatin  (CRESTOR ) 5 MG tablet Take 1 tablet (5 mg total) by mouth daily.   Semaglutide , 2 MG/DOSE, (OZEMPIC , 2 MG/DOSE,) 8 MG/3ML SOPN Inject 2 mg into the skin once a  week.   traMADol  (ULTRAM ) 50 MG tablet Take 50 mg by mouth every 6 (six) hours as needed (pain).  (Patient taking differently: Take 50 mg by mouth every 12 (twelve) hours as needed (pain).)   traMADol  (ULTRAM -ER) 300 MG 24 hr tablet Take 300 mg by mouth daily. Pt takes 300mg  in the morning   Turmeric 500 MG TABS Take by mouth daily.   icosapent  Ethyl (VASCEPA ) 1 g capsule Take 2 capsules (2 g total) by mouth 2 (two) times daily. (Patient not taking: Reported on 10/31/2024)   No facility-administered medications prior to visit.   "

## 2025-01-02 ENCOUNTER — Ambulatory Visit: Admitting: Internal Medicine

## 2025-01-30 ENCOUNTER — Ambulatory Visit: Admitting: Internal Medicine

## 2025-02-02 ENCOUNTER — Inpatient Hospital Stay

## 2025-08-04 ENCOUNTER — Inpatient Hospital Stay

## 2025-08-04 ENCOUNTER — Inpatient Hospital Stay: Admitting: Nurse Practitioner
